# Patient Record
Sex: Female | Born: 1985 | Race: White | Hispanic: No | Marital: Single | Smoking: Current every day smoker
Health system: Southern US, Community
[De-identification: ages and names within clinical notes are randomized; demographics above are authoritative.]

## PROBLEM LIST (undated history)

## (undated) DIAGNOSIS — Z789 Other specified health status: Secondary | ICD-10-CM

## (undated) HISTORY — PX: FACIAL COSMETIC SURGERY: SHX629

## (undated) HISTORY — PX: TONSILLECTOMY: SUR1361

## (undated) HISTORY — PX: COLPOSCOPY: SHX161

## (undated) HISTORY — PX: WISDOM TOOTH EXTRACTION: SHX21

---

## 2015-03-08 ENCOUNTER — Emergency Department (HOSPITAL_COMMUNITY)
Admission: EM | Admit: 2015-03-08 | Discharge: 2015-03-08 | Disposition: A | Discharge status: Home / Self Care | Payer: Self-pay | Attending: Emergency Medicine | Admitting: Emergency Medicine

## 2015-03-08 ENCOUNTER — Encounter (HOSPITAL_COMMUNITY): Payer: Self-pay | Admitting: *Deleted

## 2015-03-08 DIAGNOSIS — F17200 Nicotine dependence, unspecified, uncomplicated: Secondary | ICD-10-CM | POA: Insufficient documentation

## 2015-03-08 DIAGNOSIS — R197 Diarrhea, unspecified: Secondary | ICD-10-CM | POA: Insufficient documentation

## 2015-03-08 DIAGNOSIS — Z79899 Other long term (current) drug therapy: Secondary | ICD-10-CM | POA: Insufficient documentation

## 2015-03-08 DIAGNOSIS — R112 Nausea with vomiting, unspecified: Secondary | ICD-10-CM | POA: Insufficient documentation

## 2015-03-08 LAB — I-STAT CHEM 8, ED
BUN: 11 mg/dL (ref 6–20)
CALCIUM ION: 1.17 mmol/L (ref 1.12–1.23)
CREATININE: 0.7 mg/dL (ref 0.44–1.00)
Chloride: 102 mmol/L (ref 101–111)
GLUCOSE: 73 mg/dL (ref 65–99)
HCT: 47 % — ABNORMAL HIGH (ref 36.0–46.0)
Hemoglobin: 16 g/dL — ABNORMAL HIGH (ref 12.0–15.0)
Potassium: 3.6 mmol/L (ref 3.5–5.1)
Sodium: 139 mmol/L (ref 135–145)
TCO2: 22 mmol/L (ref 0–100)

## 2015-03-08 LAB — I-STAT BETA HCG BLOOD, ED (MC, WL, AP ONLY)

## 2015-03-08 MED ORDER — ONDANSETRON HCL 4 MG/2ML IJ SOLN
4.0000 mg | Freq: Once | INTRAMUSCULAR | Status: AC
  Administered 2015-03-08: 4 mg via INTRAVENOUS
  Filled 2015-03-08: qty 2

## 2015-03-08 MED ORDER — PROMETHAZINE HCL 25 MG PO TABS: 25.0000 mg | ORAL_TABLET | Freq: Four times a day (QID) | ORAL | Status: DC | PRN

## 2015-03-08 MED ORDER — GI COCKTAIL ~~LOC~~: 30.0000 mL | Freq: Once | ORAL | Status: DC

## 2015-03-08 MED ORDER — SODIUM CHLORIDE 0.9 % IV BOLUS (SEPSIS)
1000.0000 mL | Freq: Once | INTRAVENOUS | Status: AC
  Administered 2015-03-08: 1000 mL via INTRAVENOUS

## 2015-03-08 MED ORDER — GI COCKTAIL ~~LOC~~
30.0000 mL | Freq: Once | ORAL | Status: AC
  Administered 2015-03-08: 30 mL via ORAL
  Filled 2015-03-08: qty 30

## 2015-03-08 MED ORDER — SODIUM CHLORIDE 0.9 % IV BOLUS (SEPSIS)
1000.0000 mL | Freq: Once | INTRAVENOUS | Status: AC
Start: 2015-03-08 — End: 2015-03-08
  Administered 2015-03-08: 1000 mL via INTRAVENOUS

## 2015-03-08 MED ORDER — LORAZEPAM 1 MG PO TABS
1.0000 mg | ORAL_TABLET | Freq: Once | ORAL | Status: AC
  Administered 2015-03-08: 1 mg via ORAL
  Filled 2015-03-08: qty 1

## 2015-03-08 NOTE — ED Provider Notes (Signed)
CSN: [id number]     Arrival date & time 03/08/15  [id number] History   First MD Initiated Contact with Patient 03/08/15 1036     Chief Complaint  Patient presents with  . Nausea  . Emesis  . Diarrhea     HPI Patient presents to the emergency department complaining of nausea vomiting and diarrhea over the past 48 hours.  Decreased oral intake.  Denies abdominal pain.  No fevers or chills.  No other complaints.  No urinary complaints.   History reviewed. No pertinent past medical history. Past Surgical History  Procedure Laterality Date  . Tonsillectomy     No family history on file. Social History  Substance Use Topics  . Smoking status: Current Every Day Smoker  . Smokeless tobacco: None  . Alcohol Use: No   OB History    No data available     Review of Systems  All other systems reviewed and are negative.     Allergies  Review of patient's allergies indicates no known allergies.  Home Medications   Prior to Admission medications   Medication Sig Start Date End Date Taking? Authorizing Provider  calcium carbonate (TUMS - DOSED IN MG ELEMENTAL CALCIUM) 500 MG chewable tablet Chew 1 tablet by mouth 2 (two) times daily as needed for indigestion or heartburn.   Yes Historical Provider, MD  ranitidine (ZANTAC) 150 MG tablet Take 150 mg by mouth daily as needed for heartburn.   Yes Historical Provider, MD  sertraline (ZOLOFT) 100 MG tablet Take 100 mg by mouth daily.   Yes Historical Provider, MD  promethazine (PHENERGAN) 25 MG tablet Take 1 tablet (25 mg total) by mouth every 6 (six) hours as needed for nausea or vomiting. 03/08/15   Azalia BilisKevin Toretto Tingler, MD   BP 106/55 mmHg  Pulse 59  Temp(Src) 98.2 F (36.8 C) (Oral)  Resp 20  SpO2 99%  LMP 03/08/2015 Physical Exam  Constitutional: She is oriented to person, place, and time. She appears well-developed and well-nourished. No distress.  HENT:  Head: Normocephalic and atraumatic.  Eyes: EOM are normal.  Neck: Normal range of  motion.  Cardiovascular: Normal rate, regular rhythm and normal heart sounds.   Pulmonary/Chest: Effort normal and breath sounds normal.  Abdominal: Soft. She exhibits no distension. There is no tenderness.  Musculoskeletal: Normal range of motion.  Neurological: She is alert and oriented to person, place, and time.  Skin: Skin is warm and dry.  Psychiatric: She has a normal mood and affect. Judgment normal.  Nursing note and vitals reviewed.   ED Course  Procedures (including critical care time) Labs Review Labs Reviewed  I-STAT CHEM 8, ED - Abnormal; Notable for the following:    Hemoglobin 16.0 (*)    HCT 47.0 (*)    All other components within normal limits  I-STAT BETA HCG BLOOD, ED ([facility], [facility], [facility] ONLY)    Imaging Review No results found. I have personally reviewed and evaluated these images and lab results as part of my medical decision-making.   EKG Interpretation None      MDM   Final diagnoses:  Nausea vomiting and diarrhea    Patient is overall well-appearing.  Feels better.  Discharge home in good condition.    Azalia BilisKevin Angelette Ganus, MD 03/08/15 [phone]

## 2015-03-08 NOTE — ED Notes (Signed)
Patient states since Monday she has had consistent nausea, vomiting and diarrhea. More emesis than diarrhea. She has not tried any thing over the counter other than tums. Patient denies fever. Patient in no acute distress at this time and not actively vomiting.

## 2018-09-14 ENCOUNTER — Encounter (HOSPITAL_BASED_OUTPATIENT_CLINIC_OR_DEPARTMENT_OTHER): Payer: Self-pay | Admitting: Emergency Medicine

## 2018-09-14 ENCOUNTER — Emergency Department (HOSPITAL_BASED_OUTPATIENT_CLINIC_OR_DEPARTMENT_OTHER)
Admission: EM | Admit: 2018-09-14 | Discharge: 2018-09-14 | Disposition: A | Discharge status: Home / Self Care | Payer: Managed Care, Other (non HMO) | Attending: Emergency Medicine | Admitting: Emergency Medicine

## 2018-09-14 ENCOUNTER — Other Ambulatory Visit: Payer: Self-pay

## 2018-09-14 ENCOUNTER — Emergency Department (HOSPITAL_BASED_OUTPATIENT_CLINIC_OR_DEPARTMENT_OTHER): Payer: Managed Care, Other (non HMO)

## 2018-09-14 DIAGNOSIS — O99331 Smoking (tobacco) complicating pregnancy, first trimester: Secondary | ICD-10-CM | POA: Insufficient documentation

## 2018-09-14 DIAGNOSIS — O210 Mild hyperemesis gravidarum: Secondary | ICD-10-CM | POA: Insufficient documentation

## 2018-09-14 DIAGNOSIS — R1110 Vomiting, unspecified: Secondary | ICD-10-CM

## 2018-09-14 DIAGNOSIS — F17210 Nicotine dependence, cigarettes, uncomplicated: Secondary | ICD-10-CM | POA: Diagnosis not present

## 2018-09-14 DIAGNOSIS — Z3490 Encounter for supervision of normal pregnancy, unspecified, unspecified trimester: Secondary | ICD-10-CM

## 2018-09-14 DIAGNOSIS — Z3A01 Less than 8 weeks gestation of pregnancy: Secondary | ICD-10-CM | POA: Insufficient documentation

## 2018-09-14 DIAGNOSIS — O219 Vomiting of pregnancy, unspecified: Secondary | ICD-10-CM | POA: Diagnosis present

## 2018-09-14 LAB — CBC
HCT: 36.4 % (ref 36.0–46.0)
Hemoglobin: 12.7 g/dL (ref 12.0–15.0)
MCH: 30 pg (ref 26.0–34.0)
MCHC: 34.9 g/dL (ref 30.0–36.0)
MCV: 85.8 fL (ref 80.0–100.0)
Platelets: 196 10*3/uL (ref 150–400)
RBC: 4.24 MIL/uL (ref 3.87–5.11)
RDW: 12.5 % (ref 11.5–15.5)
WBC: 11 10*3/uL — ABNORMAL HIGH (ref 4.0–10.5)
nRBC: 0 % (ref 0.0–0.2)

## 2018-09-14 LAB — COMPREHENSIVE METABOLIC PANEL
ALT: 32 U/L (ref 0–44)
AST: 32 U/L (ref 15–41)
Albumin: 4.2 g/dL (ref 3.5–5.0)
Alkaline Phosphatase: 80 U/L (ref 38–126)
Anion gap: 13 (ref 5–15)
BUN: 11 mg/dL (ref 6–20)
CO2: 14 mmol/L — ABNORMAL LOW (ref 22–32)
Calcium: 8.6 mg/dL — ABNORMAL LOW (ref 8.9–10.3)
Chloride: 107 mmol/L (ref 98–111)
Creatinine, Ser: 0.69 mg/dL (ref 0.44–1.00)
GFR calc Af Amer: >60 mL/min (ref >60)
GFR calc non Af Amer: >60 mL/min (ref >60)
Glucose, Bld: 126 mg/dL — ABNORMAL HIGH (ref 70–99)
Potassium: 3.2 mmol/L — ABNORMAL LOW (ref 3.5–5.1)
Sodium: 134 mmol/L — ABNORMAL LOW (ref 135–145)
Total Bilirubin: 0.7 mg/dL (ref 0.3–1.2)
Total Protein: 7.2 g/dL (ref 6.5–8.1)

## 2018-09-14 LAB — BASIC METABOLIC PANEL
Anion gap: 11 (ref 5–15)
BUN: 11 mg/dL (ref 6–20)
CO2: 17 mmol/L — ABNORMAL LOW (ref 22–32)
Calcium: 8.4 mg/dL — ABNORMAL LOW (ref 8.9–10.3)
Chloride: 107 mmol/L (ref 98–111)
Creatinine, Ser: 0.64 mg/dL (ref 0.44–1.00)
GFR calc Af Amer: >60 mL/min (ref >60)
GFR calc non Af Amer: >60 mL/min (ref >60)
Glucose, Bld: 112 mg/dL — ABNORMAL HIGH (ref 70–99)
Potassium: 3.4 mmol/L — ABNORMAL LOW (ref 3.5–5.1)
Sodium: 135 mmol/L (ref 135–145)

## 2018-09-14 LAB — HCG, QUANTITATIVE, PREGNANCY: hCG, Beta Chain, Quant, S: 674 m[IU]/mL — ABNORMAL HIGH (ref <5)

## 2018-09-14 LAB — LIPASE, BLOOD: Lipase: 16 U/L (ref 11–51)

## 2018-09-14 MED ORDER — DOXYLAMINE-PYRIDOXINE 10-10 MG PO TBEC: 1.0000 | DELAYED_RELEASE_TABLET | Freq: Two times a day (BID) | ORAL | 0 refills | Status: DC | PRN

## 2018-09-14 MED ORDER — ONDANSETRON HCL 4 MG/2ML IJ SOLN
4.0000 mg | Freq: Once | INTRAMUSCULAR | Status: AC
  Administered 2018-09-14: 4 mg via INTRAVENOUS
  Filled 2018-09-14: qty 2

## 2018-09-14 MED ORDER — ONDANSETRON 4 MG PO TBDP: 4.0000 mg | ORAL_TABLET | Freq: Three times a day (TID) | ORAL | 0 refills | Status: DC | PRN

## 2018-09-14 MED ORDER — ACETAMINOPHEN 500 MG PO TABS
1000.0000 mg | ORAL_TABLET | Freq: Once | ORAL | Status: AC
Start: 2018-09-14 — End: 2018-09-14
  Administered 2018-09-14: 1000 mg via ORAL
  Filled 2018-09-14: qty 2

## 2018-09-14 MED ORDER — METOCLOPRAMIDE HCL 5 MG/ML IJ SOLN
10.0000 mg | Freq: Once | INTRAMUSCULAR | Status: AC
  Administered 2018-09-14: 10 mg via INTRAVENOUS
  Filled 2018-09-14: qty 2

## 2018-09-14 MED ORDER — DIPHENHYDRAMINE HCL 50 MG/ML IJ SOLN
25.0000 mg | Freq: Once | INTRAMUSCULAR | Status: AC
  Administered 2018-09-14: 25 mg via INTRAVENOUS
  Filled 2018-09-14: qty 1

## 2018-09-14 MED ORDER — SODIUM CHLORIDE 0.9 % IV BOLUS
1000.0000 mL | Freq: Once | INTRAVENOUS | Status: AC
  Administered 2018-09-14: 1000 mL via INTRAVENOUS

## 2018-09-14 MED FILL — DOXYLAMINE-PYRIDOXINE 10-10: 10-10 | 30 days supply | Qty: 60 | Fill #0

## 2018-09-14 MED FILL — ONDANSETRON ODT 4 MG TABLET: 4 | 6 days supply | Qty: 20 | Fill #0

## 2018-09-14 NOTE — ED Notes (Signed)
Patient transported to [facility] 

## 2018-09-14 NOTE — ED Notes (Signed)
Pt verbalized "Im feeling a little better, finally" when returned from [place]

## 2018-09-14 NOTE — ED Provider Notes (Addendum)
[facility] Emergency Department Provider Note MRN:  [id number]  Arrival date & time: 09/14/18     Chief Complaint   Nausea and Abdominal Pain   History of Present Illness   Sierra Carter is a 33 y.o. year-old female with no pertinent past medical history presenting to the ED with chief complaint of nausea and abdominal pain.  Patient has been experiencing significant nausea, vomiting, diarrhea for the past 12 hours.  Endorsing some generalized abdominal cramping discomfort.  Patient reports that she is pregnant, first day of last menstrual period was sometime in late May.  Has not had follow-up with OB or GYN.  She denies fever, no chest pain.  Feeling short of breath due to the nausea.  Discomfort is moderate to severe, constant, no exacerbating relieving factors.  Review of Systems  A complete 10 system review of systems was obtained and all systems are negative except as noted in the HPI and PMH.   Patient's Health History   History reviewed. No pertinent past medical history.  Past Surgical History:  Procedure Laterality Date  . COLPOSCOPY     x 2  . FACIAL COSMETIC SURGERY     nose job  . TONSILLECTOMY    . WISDOM TOOTH EXTRACTION      No family history on file.  Social History   Socioeconomic History  . Marital status: Single    Spouse name: Not on file  . Number of children: Not on file  . Years of education: Not on file  . Highest education level: Not on file  Occupational History  . Not on file  Social Needs  . Financial resource strain: Not on file  . Food insecurity    Worry: Not on file    Inability: Not on file  . Transportation needs    Medical: Not on file    Non-medical: Not on file  Tobacco Use  . Smoking status: Current Every Day Smoker    Packs/day: 0.50    Types: Cigarettes  . Smokeless tobacco: Never Used  Substance and Sexual Activity  . Alcohol use: No  . Drug use: Yes    Types: Marijuana  . Sexual activity:  Yes  Lifestyle  . Physical activity    Days per week: Not on file    Minutes per session: Not on file  . Stress: Not on file  Relationships  . Social Musicianconnections    Talks on phone: Not on file    Gets together: Not on file    Attends religious service: Not on file    Active member of club or organization: Not on file    Attends meetings of clubs or organizations: Not on file    Relationship status: Not on file  . Intimate partner violence    Fear of current or ex partner: Not on file    Emotionally abused: Not on file    Physically abused: Not on file    Forced sexual activity: Not on file  Other Topics Concern  . Not on file  Social History Narrative  . Not on file     Physical Exam  Vital Signs and Nursing Notes reviewed Vitals:   09/14/18 0655 09/14/18 0657  BP:  119/60  Pulse:  61  Resp:  (!) 22  Temp:  97.9 F (36.6 C)  SpO2: 100% 100%    CONSTITUTIONAL: Well-appearing, in moderate distress due to discomfort NEURO:  Alert and oriented x 3, no focal deficits EYES:  eyes equal and reactive ENT/NECK:  no LAD, no JVD CARDIO: Regular rate, well-perfused, normal S1 and S2 PULM:  CTAB no wheezing or rhonchi, tachypneic GI/GU:  normal bowel sounds, non-distended, non-tender MSK/SPINE:  No gross deformities, no edema SKIN:  no rash, atraumatic PSYCH:  Appropriate speech and behavior  Diagnostic and Interventional Summary    EKG Interpretation  Date/Time:  Tuesday September 14 2018 07:18:37 EDT Ventricular Rate:  81 PR Interval:    QRS Duration: 78 QT Interval:  391 QTC Calculation: 454 R Axis:   79 Text Interpretation:  Sinus rhythm Confirmed by Kennis CarinaBero, Breigh Annett [phone]([id number]) on 09/14/2018 7:26:31 AM      Labs Reviewed  CBC - Abnormal; Notable for the following components:      Result Value   WBC 11.0 (*)    All other components within normal limits  COMPREHENSIVE METABOLIC PANEL - Abnormal; Notable for the following components:   Sodium 134 (*)    Potassium 3.2 (*)     CO2 14 (*)    Glucose, Bld 126 (*)    Calcium 8.6 (*)    All other components within normal limits  HCG, QUANTITATIVE, PREGNANCY - Abnormal; Notable for the following components:   hCG, Beta Chain, Quant, S 674 (*)    All other components within normal limits  BASIC METABOLIC PANEL - Abnormal; Notable for the following components:   Potassium 3.4 (*)    CO2 17 (*)    Glucose, Bld 112 (*)    Calcium 8.4 (*)    All other components within normal limits  LIPASE, BLOOD  URINALYSIS, ROUTINE W REFLEX MICROSCOPIC    US OB Comp < 14 Wks  Final Result    US OB Transvaginal  Final Result      Medications  sodium chloride 0.9 % bolus 1,000 mL (0 mLs Intravenous Stopped 09/14/18 0835)  metoCLOPramide (REGLAN) injection 10 mg (10 mg Intravenous Given 09/14/18 0720)  diphenhydrAMINE (BENADRYL) injection 25 mg (25 mg Intravenous Given 09/14/18 0720)  acetaminophen (TYLENOL) tablet 1,000 mg (1,000 mg Oral Given 09/14/18 [id number])     Procedures Critical Care  ED Course and Medical Decision Making  I have reviewed the triage vital signs and the nursing notes.  Pertinent labs & imaging results that were available during my care of the patient were reviewed by me and considered in my medical decision making (see below for details).  Favoring hyperemesis gravidarum, will attempt symptomatic management and obtain labs to evaluate for metabolic disarray versus AKI.  We will also obtain ultrasound imaging today to confirm intrauterine pregnancy and exclude molar pregnancy.  Clinical Course as of Sep 13 1036  Tue Sep 14, 2018  [id number] Based on patient's reported first day of last menstrual period, her estimated gestational age is between 636 and 7 weeks   [MB]  [phone] Patient has made a dramatic recovery, looking and feeling much better, now tolerating p.o.  Patient's labs showed a fairly significant metabolic alkalosis, likely related to her frequent vomiting.  Will recheck to ensure it is improving and  then hopefully discharge.   [MB]    Clinical Course User Index [MB] Sabas SousBero, Cleland Simkins M, MD    BMP shows improving values, patient continues to look and feel well, tolerating p.o., appropriate for discharge with symptomatic management and GYN follow-up.  11 AM update: Patient began having return of severe symptoms just prior to discharge.  No longer tolerating p.o.  Had a total of 30 minutes of relief.  Will request admission  for symptomatic management of hyperemesis gravidarum.  11:20 AM update: Patient feeling much better after IV Zofran and would like to try to manage the symptoms at home, no longer interested in admission.  This seems appropriate given how she appears, now tolerating p.o.  Discharged with nausea regimen.  Barth Kirks. Sedonia Small, [place] [email or web address]  Final Clinical Impressions(s) / ED Diagnoses     ICD-10-CM   1. Pregnancy at early stage  Z34.90   2. Hyperemesis  R11.10     ED Discharge Orders         Ordered    Doxylamine-Pyridoxine 10-10 MG TBEC  2 times daily PRN     09/14/18 1037             Maudie Flakes, MD 09/14/18 1038    Maudie Flakes, MD 09/14/18 1109    Maudie Flakes, MD 09/14/18 1125

## 2018-09-14 NOTE — Discharge Instructions (Signed)
You were evaluated in the Emergency Department and after careful evaluation, we did not find any emergent condition requiring admission or further testing in the hospital.  Your symptoms today seem to be due to morning sickness.  You can use the medication provided as needed for continued symptoms.  We recommend following up with a GYN doctor for continued care.  You will need a repeat ultrasound in about 2 weeks.  Please return to the Emergency Department if you experience any worsening of your condition.  We encourage you to follow up with a primary care provider.  Thank you for allowing [place] to be a part of your care.

## 2018-09-14 NOTE — ED Notes (Signed)
Pt c/o abdominal pain and N/V x 2 days. Pt states she is pregnant. Pt writhing on stretcher, states she cant get comfortable. Pt denies fever.

## 2018-09-15 ENCOUNTER — Ambulatory Visit: Payer: Managed Care, Other (non HMO) | Admitting: Obstetrics & Gynecology

## 2018-09-16 ENCOUNTER — Telehealth: Payer: Self-pay

## 2018-09-16 ENCOUNTER — Ambulatory Visit: Payer: Managed Care, Other (non HMO) | Admitting: Obstetrics & Gynecology

## 2018-09-16 NOTE — Telephone Encounter (Signed)
Called and left message for patient to return call to office to start virtual visit. Kathrene Alu RN

## 2019-04-11 ENCOUNTER — Inpatient Hospital Stay (HOSPITAL_COMMUNITY)
Admission: AD | Admit: 2019-04-11 | Discharge: 2019-04-11 | Disposition: A | Discharge status: Home / Self Care | Payer: 59 | Attending: Obstetrics and Gynecology | Admitting: Obstetrics and Gynecology

## 2019-04-11 ENCOUNTER — Encounter (HOSPITAL_COMMUNITY): Payer: Self-pay | Admitting: Obstetrics and Gynecology

## 2019-04-11 ENCOUNTER — Telehealth: Payer: Self-pay

## 2019-04-11 ENCOUNTER — Other Ambulatory Visit: Payer: Self-pay

## 2019-04-11 ENCOUNTER — Inpatient Hospital Stay (HOSPITAL_COMMUNITY): Payer: 59

## 2019-04-11 DIAGNOSIS — O209 Hemorrhage in early pregnancy, unspecified: Secondary | ICD-10-CM | POA: Diagnosis present

## 2019-04-11 DIAGNOSIS — Z3A01 Less than 8 weeks gestation of pregnancy: Secondary | ICD-10-CM | POA: Insufficient documentation

## 2019-04-11 DIAGNOSIS — O039 Complete or unspecified spontaneous abortion without complication: Secondary | ICD-10-CM | POA: Diagnosis not present

## 2019-04-11 DIAGNOSIS — F17210 Nicotine dependence, cigarettes, uncomplicated: Secondary | ICD-10-CM | POA: Diagnosis not present

## 2019-04-11 DIAGNOSIS — Z6790 Unspecified blood type, Rh positive: Secondary | ICD-10-CM

## 2019-04-11 DIAGNOSIS — O4690 Antepartum hemorrhage, unspecified, unspecified trimester: Secondary | ICD-10-CM

## 2019-04-11 HISTORY — DX: Other specified health status: Z78.9

## 2019-04-11 LAB — CBC
HCT: 36.8 % (ref 36.0–46.0)
Hemoglobin: 12.8 g/dL (ref 12.0–15.0)
MCH: 30.5 pg (ref 26.0–34.0)
MCHC: 34.8 g/dL (ref 30.0–36.0)
MCV: 87.6 fL (ref 80.0–100.0)
Platelets: 176 10*3/uL (ref 150–400)
RBC: 4.2 MIL/uL (ref 3.87–5.11)
RDW: 12 % (ref 11.5–15.5)
WBC: 9.4 10*3/uL (ref 4.0–10.5)
nRBC: 0 % (ref 0.0–0.2)

## 2019-04-11 LAB — COMPREHENSIVE METABOLIC PANEL
ALT: 22 U/L (ref 0–44)
AST: 20 U/L (ref 15–41)
Albumin: 3.8 g/dL (ref 3.5–5.0)
Alkaline Phosphatase: 63 U/L (ref 38–126)
Anion gap: 10 (ref 5–15)
BUN: 9 mg/dL (ref 6–20)
CO2: 25 mmol/L (ref 22–32)
Calcium: 9.1 mg/dL (ref 8.9–10.3)
Chloride: 103 mmol/L (ref 98–111)
Creatinine, Ser: 0.79 mg/dL (ref 0.44–1.00)
GFR calc Af Amer: >60 mL/min (ref >60)
GFR calc non Af Amer: >60 mL/min (ref >60)
Glucose, Bld: 83 mg/dL (ref 70–99)
Potassium: 3.9 mmol/L (ref 3.5–5.1)
Sodium: 138 mmol/L (ref 135–145)
Total Bilirubin: 0.3 mg/dL (ref 0.3–1.2)
Total Protein: 6.5 g/dL (ref 6.5–8.1)

## 2019-04-11 LAB — URINALYSIS, ROUTINE W REFLEX MICROSCOPIC
Bilirubin Urine: NEGATIVE
Glucose, UA: NEGATIVE mg/dL
Ketones, ur: NEGATIVE mg/dL
Nitrite: NEGATIVE
Protein, ur: 30 mg/dL — AB
Specific Gravity, Urine: 1.015 (ref 1.005–1.030)
WBC, UA: >50 WBC/hpf — ABNORMAL HIGH (ref 0–5)
pH: 5 (ref 5.0–8.0)

## 2019-04-11 LAB — HCG, QUANTITATIVE, PREGNANCY: hCG, Beta Chain, Quant, S: 2354 m[IU]/mL — ABNORMAL HIGH (ref <5)

## 2019-04-11 LAB — POCT PREGNANCY, URINE: Preg Test, Ur: POSITIVE — AB

## 2019-04-11 MED ORDER — OXYCODONE-ACETAMINOPHEN 5-325 MG PO TABS
1.0000 | ORAL_TABLET | Freq: Once | ORAL | Status: AC
  Administered 2019-04-11: 1 via ORAL
  Filled 2019-04-11: qty 1

## 2019-04-11 MED ORDER — OXYCODONE-ACETAMINOPHEN 5-325 MG PO TABS: 1.0000 | ORAL_TABLET | Freq: Four times a day (QID) | ORAL | 0 refills | Status: AC | PRN

## 2019-04-11 NOTE — MAU Provider Note (Signed)
History     CSN: [id number]  Arrival date and time: 04/11/19 1223   First Provider Initiated Contact with Patient 04/11/19 1352      Chief Complaint  Patient presents with  . Vaginal Bleeding  . Abdominal Pain  . Possible Pregnancy   [name] is a 34 y.o. G2P0010 at [redacted]w[redacted]d who presents to MAU for vaginal bleeding which began yesterday, early afternoon. Pt reports the bleeding started as spotting that she first noticed in her underwear and reports it was a dark-red color. Pt reports she put on a pad, which started to accumulate blood within an hour. Pt reports cramping started last night and bleeding got heavier. Pt reports prior to bedtime last night, she was experiencing clotting, and this morning when she awoke, her pad was "totally full." Pt reports bleeding had been dark, but reports bleeding is bright red here in MAU. Pt believes this is a miscarriage, as she experienced one in July. Pt offered and declines opportunity to speak with a counselor.  Passing blood clots? per above Blood soaking clothes? no Lightheaded/dizzy? no Significant pelvic pain or cramping? per above, 7/10, pt reports she has bad menstrual cramps and this is similar Passed any tissue? no  Current pregnancy problems? Pt has not yet been seen Blood Type? unknown Allergies? NKDA Current medications? Ibuprofen (last took 800mg  @8AM ), Zoloftm Pepcid Current [facility] & next appt? [facility], 04/26/2019  Pt denies vaginal discharge/odor/itching. Pt denies N/V, abdominal pain, constipation, diarrhea, or urinary problems. Pt denies fever, chills, fatigue, sweating or changes in appetite. Pt denies SOB or chest pain. Pt denies dizziness, HA, light-headedness, weakness.   OB History    Gravida  2   Para      Term      Preterm      AB  1   Living        SAB  1   TAB      Ectopic      Multiple      Live Births              Past Medical History:  Diagnosis Date  . Medical history  non-contributory     Past Surgical History:  Procedure Laterality Date  . COLPOSCOPY     x 2  . FACIAL COSMETIC SURGERY     nose job  . TONSILLECTOMY    . WISDOM TOOTH EXTRACTION      No family history on file.  Social History   Tobacco Use  . Smoking status: Current Every Day Smoker    Packs/day: 0.50    Types: Cigarettes  . Smokeless tobacco: Never Used  Substance Use Topics  . Alcohol use: No  . Drug use: Yes    Types: Marijuana    Allergies: No Known Allergies  Medications Prior to Admission  Medication Sig Dispense Refill Last Dose  . famotidine (PEPCID) 20 MG tablet Take 20 mg by mouth daily.     . sertraline (ZOLOFT) 100 MG tablet Take 100 mg by mouth daily.   04/11/2019 at Unknown time  . calcium carbonate (TUMS - DOSED IN MG ELEMENTAL CALCIUM) 500 MG chewable tablet Chew 1 tablet by mouth 2 (two) times daily as needed for indigestion or heartburn.     . Doxylamine-Pyridoxine 10-10 MG TBEC Take 1 tablet by mouth 2 (two) times daily as needed (nausea). 60 tablet 0   . ondansetron (ZOFRAN ODT) 4 MG disintegrating tablet Take 1 tablet (4 mg total) by mouth  every 8 (eight) hours as needed for nausea or vomiting. 20 tablet 0   . promethazine (PHENERGAN) 25 MG tablet Take 1 tablet (25 mg total) by mouth every 6 (six) hours as needed for nausea or vomiting. 12 tablet 0   . ranitidine (ZANTAC) 150 MG tablet Take 150 mg by mouth daily as needed for heartburn.       Review of Systems  Constitutional: Negative for chills, diaphoresis, fatigue and fever.  Eyes: Negative for visual disturbance.  Respiratory: Negative for shortness of breath.   Cardiovascular: Negative for chest pain.  Gastrointestinal: Negative for abdominal pain, constipation, diarrhea, nausea and vomiting.  Genitourinary: Positive for pelvic pain and vaginal bleeding. Negative for dysuria, flank pain, frequency, urgency and vaginal discharge.  Musculoskeletal: Positive for back pain.  Neurological:  Negative for dizziness, weakness, light-headedness and headaches.   Physical Exam   Blood pressure 119/60, pulse 73, temperature 99 F (37.2 C), temperature source Oral, resp. rate 18, height 5' 6.5" (1.689 m), weight 80 kg, last menstrual period 02/16/2019, SpO2 99 %, unknown if currently breastfeeding.  Patient Vitals for the past 24 hrs:  BP Temp Temp src Pulse Resp SpO2 Height Weight  04/11/19 1254 119/60 99 F (37.2 C) Oral 73 18 99 % 5' 6.5" (1.689 m) 80 kg   Physical Exam  Constitutional: She is oriented to person, place, and time. She appears well-developed and well-nourished. No distress.  HENT:  Head: Normocephalic and atraumatic.  Respiratory: Effort normal.  GI: Soft.  Neurological: She is alert and oriented to person, place, and time.  Skin: Skin is warm and dry. She is not diaphoretic.  Psychiatric: She has a normal mood and affect. Her behavior is normal. Judgment and thought content normal.  Pt declines pelvic exam after discussion of importance and possible relief of symptoms if gestational sac/debris present at cervical os, pt declines after discussion stating "I just can't take any more today."  Results for orders placed or performed during the hospital encounter of 04/11/19 (from the past 24 hour(s))  Pregnancy, urine POC     Status: Abnormal   Collection Time: 04/11/19  1:07 PM  Result Value Ref Range   Preg Test, Ur POSITIVE (A) NEGATIVE  CBC     Status: None   Collection Time: 04/11/19  1:26 PM  Result Value Ref Range   WBC 9.4 4.0 - 10.5 K/uL   RBC 4.20 3.87 - 5.11 MIL/uL   Hemoglobin 12.8 12.0 - 15.0 g/dL   HCT 36.1 44.3 - 15.4 %   MCV 87.6 80.0 - 100.0 fL   MCH 30.5 26.0 - 34.0 pg   MCHC 34.8 30.0 - 36.0 g/dL   RDW 00.8 67.6 - 19.5 %   Platelets 176 150 - 400 K/uL   nRBC 0.0 0.0 - 0.2 %  Comprehensive metabolic panel     Status: None   Collection Time: 04/11/19  1:26 PM  Result Value Ref Range   Sodium 138 135 - 145 mmol/L   Potassium 3.9 3.5 -  5.1 mmol/L   Chloride 103 98 - 111 mmol/L   CO2 25 22 - 32 mmol/L   Glucose, Bld 83 70 - 99 mg/dL   BUN 9 6 - 20 mg/dL   Creatinine, Ser 0.93 0.44 - 1.00 mg/dL   Calcium 9.1 8.9 - 26.7 mg/dL   Total Protein 6.5 6.5 - 8.1 g/dL   Albumin 3.8 3.5 - 5.0 g/dL   AST 20 15 - 41 U/L   ALT 22  0 - 44 U/L   Alkaline Phosphatase 63 38 - 126 U/L   Total Bilirubin 0.3 0.3 - 1.2 mg/dL   GFR calc non Af Amer >60 >60 mL/min   GFR calc Af Amer >60 >60 mL/min   Anion gap 10 5 - 15  ABO/Rh     Status: None   Collection Time: 04/11/19  1:26 PM  Result Value Ref Range   ABO/RH(D)      A POS Performed at [facility], [address] [place], [place] [address]   hCG, quantitative, pregnancy     Status: Abnormal   Collection Time: 04/11/19  1:26 PM  Result Value Ref Range   hCG, Beta Chain, Quant, S 2,354 (H) <5 mIU/mL  Urinalysis, Routine w reflex microscopic     Status: Abnormal   Collection Time: 04/11/19  1:40 PM  Result Value Ref Range   Color, Urine YELLOW YELLOW   APPearance HAZY (A) CLEAR   Specific Gravity, Urine 1.015 1.005 - 1.030   pH 5.0 5.0 - 8.0   Glucose, UA NEGATIVE NEGATIVE mg/dL   Hgb urine dipstick LARGE (A) NEGATIVE   Bilirubin Urine NEGATIVE NEGATIVE   Ketones, ur NEGATIVE NEGATIVE mg/dL   Protein, ur 30 (A) NEGATIVE mg/dL   Nitrite NEGATIVE NEGATIVE   Leukocytes,Ua MODERATE (A) NEGATIVE   RBC / HPF 6-10 0 - 5 RBC/hpf   WBC, UA >50 (H) 0 - 5 WBC/hpf   Bacteria, UA FEW (A) NONE SEEN   Squamous Epithelial / LPF 0-5 0 - 5   Mucus PRESENT    US OB LESS THAN 14 WEEKS WITH OB TRANSVAGINAL  Result Date: 04/11/2019 CLINICAL DATA:  Heavy vaginal bleeding and positive urine pregnancy test. Quantitative beta HCG is pending. LMP was 02/16/2019. EDC by LMP is 11/23/2019. By LMP patient is 7 weeks 5 days. Gravida 2 AB1. EXAM: OBSTETRIC <14 WK [place] AND TRANSVAGINAL OB US TECHNIQUE: Both transabdominal and transvaginal ultrasound examinations were performed for complete evaluation  of the gestation as well as the maternal uterus, adnexal regions, and pelvic cul-de-sac. Transvaginal technique was performed to assess early pregnancy. COMPARISON:  09/14/2018 FINDINGS: Intrauterine gestational sac: None Yolk sac:  Not Visualized. Embryo:  Not Visualized. Cardiac Activity: Not Visualized. Subchorionic hemorrhage:  None visualized. Maternal uterus/adnexae: Endometrium is homogeneously thickened, 13 millimeters in thickness. The ovaries are normal in appearance. Trace amount of free pelvic fluid. IMPRESSION: 1. Pregnancy of unknown location. 2. Considerations include completed spontaneous abortion, early intrauterine pregnancy or early ectopic pregnancy. 3. Serial quantitative beta HCG values and follow-up ultrasound are recommended as appropriate to document progression of and location of pregnancy. 4. Ectopic pregnancy has not been excluded. Electronically Signed   By: Norva Pavlov M.D.   On: 04/11/2019 14:52   MAU Course  Procedures  MDM -r/o ectopic -UA: hazy/lg hgb/30PRO/mod leuks/few bacteria, sending urine for culture -CBC: WNL -CMP: WNL -[place]: pregnancy of unknown anatomic location -hCG: 2,354 -ABO: A Positive -WetPrep/GC/CT not performed d/t pt declined pelvic exam -suspect miscarriage -pt given Percocet for pain after discussion of differential diagnoses, pt elects to use Percocet and requests RX for home use -patient passed object resembling likely gestational sac while in MAU, sent to pathology -pt reports pain after Percocet 1/10 -pt discharged to home in stable condition  Orders Placed This Encounter  Procedures  . Wet prep, genital    Standing Status:   Standing    Number of Occurrences:   1  . Culture, OB Urine  Standing Status:   Standing    Number of Occurrences:   1  . US OB LESS THAN 14 WEEKS WITH OB TRANSVAGINAL    Standing Status:   Standing    Number of Occurrences:   1    Order Specific Question:   Symptom/Reason for Exam    Answer:    Vaginal bleeding in pregnancy [[id number]]  . Urinalysis, Routine w reflex microscopic    Standing Status:   Standing    Number of Occurrences:   1  . CBC    Standing Status:   Standing    Number of Occurrences:   1  . Comprehensive metabolic panel    Standing Status:   Standing    Number of Occurrences:   1  . hCG, quantitative, pregnancy    Standing Status:   Standing    Number of Occurrences:   1  . Pregnancy, urine POC    Standing Status:   Standing    Number of Occurrences:   1  . ABO/Rh    Standing Status:   Standing    Number of Occurrences:   1  . Discharge patient    Order Specific Question:   Discharge disposition    Answer:   01-Home or Self Care [1]    Order Specific Question:   Discharge patient date    Answer:   04/11/2019   Meds ordered this encounter  Medications  . oxyCODONE-acetaminophen (PERCOCET/ROXICET) 5-325 MG per tablet 1 tablet  . oxyCODONE-acetaminophen (PERCOCET) 5-325 MG tablet    Sig: Take 1-2 tablets by mouth every 6 (six) hours as needed for up to 8 doses for severe pain.    Dispense:  8 tablet    Refill:  0    Order Specific Question:   Supervising Provider    Answer:   Conan Bowens [[id number]]   Assessment and Plan   1. Miscarriage   2. Vaginal bleeding in pregnancy   3. Blood type, Rh positive    Allergies as of 04/11/2019   No Known Allergies     Medication List    TAKE these medications   calcium carbonate 500 MG chewable tablet Commonly known as: TUMS - dosed in mg elemental calcium Chew 1 tablet by mouth 2 (two) times daily as needed for indigestion or heartburn.   Doxylamine-Pyridoxine 10-10 MG Tbec Take 1 tablet by mouth 2 (two) times daily as needed (nausea).   famotidine 20 MG tablet Commonly known as: PEPCID Take 20 mg by mouth daily.   ondansetron 4 MG disintegrating tablet Commonly known as: Zofran ODT Take 1 tablet (4 mg total) by mouth every 8 (eight) hours as needed for nausea or vomiting.   oxyCODONE-acetaminophen  5-325 MG tablet Commonly known as: Percocet Take 1-2 tablets by mouth every 6 (six) hours as needed for up to 8 doses for severe pain.   promethazine 25 MG tablet Commonly known as: PHENERGAN Take 1 tablet (25 mg total) by mouth every 6 (six) hours as needed for nausea or vomiting.   ranitidine 150 MG tablet Commonly known as: ZANTAC Take 150 mg by mouth daily as needed for heartburn.   sertraline 100 MG tablet Commonly known as: ZOLOFT Take 100 mg by mouth daily.       -discussed differential diagnoses including miscarriage, early pregnancy, ectopic pregnancy -RX Percocet -work note given per pt request -message sent to [facility] to schedule patient for f/u hCG on Wednesday 04/13/2019 -pt advised to call [facility] on  Tuesday afternoon if she does not hear from them prior -pt advised pelvic rest until resolution of miscarriage or determination of pregnancy status -strict ectopic/bleeding/pain/return MAU precautions discussed -message -pt discharged to home in stable condition  Joni Reining E Ying Blankenhorn 04/11/2019, 5:29 PM

## 2019-04-11 NOTE — Telephone Encounter (Signed)
Pt is scheduled for a new ob appt on 04/26/19.Pt called the office stating she is having some heavy bleeding. Advised pt to go to [facility]. Understanding was voiced.  Alvenia Treese l Alban Marucci, CMA

## 2019-04-11 NOTE — MAU Note (Signed)
A week and a half ago, took 3 HPTs, all were +.  Yesterday, started cramping and spotting, has increased since then. Has not had preg confirmed yet, has appt 2/23.

## 2019-04-11 NOTE — Discharge Instructions (Signed)
Miscarriage A miscarriage is the loss of an unborn baby (fetus) before the 20th week of pregnancy. Most miscarriages happen during the first 3 months of pregnancy. Sometimes, a miscarriage can happen before a woman knows that she is pregnant. Having a miscarriage can be an emotional experience. If you have had a miscarriage, talk with your health care provider about any questions you may have about miscarrying, the grieving process, and your plans for future pregnancy. What are the causes? A miscarriage may be caused by:  Problems with the genes or chromosomes of the fetus. These problems make it impossible for the baby to develop normally. They are often the result of random errors that occur early in the development of the baby, and are not passed from parent to child (not inherited).  Infection of the cervix or uterus.  Conditions that affect hormone balance in the body.  Problems with the cervix, such as the cervix opening and thinning before pregnancy is at term (cervical insufficiency).  Problems with the uterus. These may include: ? A uterus with an abnormal shape. ? Fibroids in the uterus. ? Congenital abnormalities. These are problems that were present at birth.  Certain medical conditions.  Smoking, drinking alcohol, or using drugs.  Injury (trauma). In many cases, the cause of a miscarriage is not known. What are the signs or symptoms? Symptoms of this condition include:  Vaginal bleeding or spotting, with or without cramps or pain.  Pain or cramping in the abdomen or lower back.  Passing fluid, tissue, or blood clots from the vagina. How is this diagnosed? This condition may be diagnosed based on:  A physical exam.  Ultrasound.  Blood tests.  Urine tests. How is this treated? Treatment for a miscarriage is sometimes not necessary if you naturally pass all the tissue that was in your uterus. If necessary, this condition may be treated with:  Dilation and  curettage (D&C). This is a procedure in which the cervix is stretched open and the lining of the uterus (endometrium) is scraped. This is done only if tissue from the fetus or placenta remains in the body (incomplete miscarriage).  Medicines, such as: ? Antibiotic medicine, to treat infection. ? Medicine to help the body pass any remaining tissue. ? Medicine to reduce (contract) the size of the uterus. These medicines may be given if you have a lot of bleeding. If you have Rh negative blood and your baby was Rh positive, you will need a shot of a medicine called Rh immunoglobulinto protect your future babies from Rh blood problems. "Rh-negative" and "Rh-positive" refer to whether or not the blood has a specific protein found on the surface of red blood cells (Rh factor). Follow these instructions at home: Medicines   Take over-the-counter and prescription medicines only as told by your health care provider.  If you were prescribed antibiotic medicine, take it as told by your health care provider. Do not stop taking the antibiotic even if you start to feel better.  Do not take NSAIDs, such as aspirin and ibuprofen, unless they are approved by your health care provider. These medicines can cause bleeding. Activity  Rest as directed. Ask your health care provider what activities are safe for you.  Have someone help with home and family responsibilities during this time. General instructions  Keep track of the number of sanitary pads you use each day and how soaked (saturated) they are. Write down this information.  Monitor the amount of tissue or blood clots that   you pass from your vagina. Save any large amounts of tissue for your health care provider to examine.  Do not use tampons, douche, or have sex until your health care provider approves.  To help you and your partner with the process of grieving, talk with your health care provider or seek counseling.  When you are ready, meet with  your health care provider to discuss any important steps you should take for your health. Also, discuss steps you should take to have a healthy pregnancy in the future.  Keep all follow-up visits as told by your health care provider. This is important. Where to find more information  The American Congress of Obstetricians and Gynecologists: www.acog.org  [place] Department of Health and Human Services Office of Women's Health: www.womenshealth.gov Contact a health care provider if:  You have a fever or chills.  You have a foul smelling vaginal discharge.  You have more bleeding instead of less. Get help right away if:  You have severe cramps or pain in your back or abdomen.  You pass blood clots or tissue from your vagina that is walnut-sized or larger.  You soak more than 1 regular sanitary pad in an hour.  You become light-headed or weak.  You pass out.  You have feelings of sadness that take over your thoughts, or you have thoughts of hurting yourself. Summary  Most miscarriages happen in the first 3 months of pregnancy. Sometimes miscarriage happens before a woman even knows that she is pregnant.  Follow your health care provider's instruction for home care. Keep all follow-up appointments.  To help you and your partner with the process of grieving, talk with your health care provider or seek counseling. This information is not intended to replace advice given to you by your health care provider. Make sure you discuss any questions you have with your health care provider. Document Revised: 06/11/2018 Document Reviewed: 03/25/2016 Elsevier Patient Education  2020 Else[employer] Inc.   Managing Pregnancy Loss Pregnancy loss can happen any time during a pregnancy. Often the cause is not known. It is rarely because of anything you did. Pregnancy loss in early pregnancy (during the first trimester) is called a miscarriage. This type of pregnancy loss is the most common. Pregnancy loss  that happens after 20 weeks of pregnancy is called fetal demise if the baby's heart stops beating before birth. Fetal demise is much less common. Some women experience spontaneous labor shortly after fetal demise resulting in a stillborn birth (stillbirth). Any pregnancy loss can be devastating. You will need to recover both physically and emotionally. Most women are able to get pregnant again after a pregnancy loss and deliver a healthy baby. How to manage emotional recovery  Pregnancy loss is very hard emotionally. You may feel many different emotions while you grieve. You may feel sad and angry. You may also feel guilty. It is normal to have periods of crying. Emotional recovery can take longer than physical recovery. It is different for everyone. Taking these steps can help you in managing this loss:  Remember that it is unlikely you did anything to cause the pregnancy loss.  Share your thoughts and feelings with friends, family, and your partner. Remember that your partner is also recovering emotionally.  Make sure you have a good support system. Do not spend too much time alone.  Meet with a pregnancy loss counselor or join a pregnancy loss support group.  Get enough sleep and eat a healthy diet. Return to regular exercise   when you have recovered physically.  Do not use drugs or alcohol to manage your emotions.  Consider seeing a mental health professional to help you recover emotionally.  Ask a friend or loved one to help you decide what to do with any clothing and nursery items you received for your baby. In the case of a stillbirth, many women benefit from taking additional steps in the grieving process. You may want to:  Hold your baby after the birth.  Name your baby.  Request a birth certificate.  Create a keepsake such as handprints or footprints.  Dress your baby and have a picture taken.  Make funeral arrangements.  Ask for a baptism or blessing. Hospitals have  staff members who can help you with all these arrangements. How to recognize emotional stress It is normal to have emotional stress after a pregnancy loss. But emotional stress that lasts a long time or becomes severe requires treatment. Watch out for these signs of severe emotional stress:  Sadness, anger, or guilt that is not going away and is interfering with your normal activities.  Relationship problems that have occurred or gotten worse since the pregnancy loss.  Signs of depression that last longer than 2 weeks. These may include: ? Sadness. ? Anxiety. ? Hopelessness. ? Loss of interest in activities you enjoy. ? Inability to concentrate. ? Trouble sleeping or sleeping too much. ? Loss of appetite or overeating. ? Thoughts of death or of hurting yourself. Follow these instructions at home:  Take over-the-counter and prescription medicines only as told by your health care provider.  Rest at home until your energy level returns. Return to your normal activities as told by your health care provider. Ask your health care provider what activities are safe for you.  When you are ready, meet with your health care provider to discuss steps to take for a future pregnancy.  Keep all follow-up visits as told by your health care provider. This is important. Where to find support  To help you and your partner with the process of grieving, talk with your health care provider or seek counseling.  Consider meeting with others who have experienced pregnancy loss. Ask your health care provider about support groups and resources. Where to find more information  [place] Department of Health and Human Services Office on Women's Health: www.womenshealth.gov  American Pregnancy Association: www.americanpregnancy.org Contact a health care provider if:  You continue to experience grief, sadness, or lack of motivation for everyday activities, and those feelings do not improve over time.  You are  struggling to recover emotionally, especially if you are using alcohol or substances to help. Get help right away if:  You have thoughts of hurting yourself or others. If you ever feel like you may hurt yourself or others, or have thoughts about taking your own life, get help right away. You can go to your nearest emergency department or call:  Your local emergency services (911 in the [place]  A suicide crisis helpline, such as the National Suicide Prevention Lifeline at 1-800-273-8255. This is open 24 hours a day. Summary  Any pregnancy loss can be difficult physically and emotionally.  You may experience many different emotions while you grieve. Emotional recovery can last longer than physical recovery.  It is normal to have emotional stress after a pregnancy loss. But emotional stress that lasts a long time or becomes severe requires treatment.  See your health care provider if you are struggling emotionally after a pregnancy loss. This information   is not intended to replace advice given to you by your health care provider. Make sure you discuss any questions you have with your health care provider. Document Revised: 06/09/2018 Document Reviewed: 04/30/2017 Elsevier Patient Education  2020 Else[employer] Inc.  Ectopic Pregnancy  An ectopic pregnancy is when the fertilized egg attaches (implants) outside the uterus. Most ectopic pregnancies occur in one of the tubes where eggs travel from the ovary to the uterus (fallopian tubes), but the implanting can occur in other locations. In rare cases, ectopic pregnancies occur on the ovary, intestine, pelvis, abdomen, or cervix. In an ectopic pregnancy, the fertilized egg does not have the ability to develop into a normal, healthy baby. A ruptured ectopic pregnancy is one in which tearing or bursting of a fallopian tube causes internal bleeding. Often, there is intense lower abdominal pain, and vaginal bleeding sometimes occurs. Having an ectopic  pregnancy can be life-threatening. If this dangerous condition is not treated, it can lead to blood loss, shock, or even death. What are the causes? The most common cause of this condition is damage to one of the fallopian tubes. A fallopian tube may be narrowed or blocked, and that keeps the fertilized egg from reaching the uterus. What increases the risk? This condition is more likely to develop in women of childbearing age who have different levels of risk. The levels of risk can be divided into three categories. High risk  You have gone through infertility treatment.  You have had an ectopic pregnancy before.  You have had surgery on the fallopian tubes, or another surgical procedure, such as an abortion.  You have had surgery to have the fallopian tubes tied (tubal ligation).  You have problems or diseases of the fallopian tubes.  You have been exposed to diethylstilbestrol (DES). This medicine was used until 1971, and it had effects on babies whose mothers took the medicine.  You become pregnant while using an IUD (intrauterine device) for birth control. Moderate risk  You have a history of infertility.  You have had an STI (sexually transmitted infection).  You have a history of pelvic inflammatory disease (PID).  You have scarring from endometriosis.  You have multiple sexual partners.  You smoke. Low risk  You have had pelvic surgery.  You use vaginal douches.  You became sexually active before age 67. What are the signs or symptoms? Common symptoms of this condition include normal pregnancy symptoms, such as missing a period, nausea, tiredness, abdominal pain, breast tenderness, and bleeding. However, ectopic pregnancy will have additional symptoms, such as:  Pain with intercourse.  Irregular vaginal bleeding or spotting.  Cramping or pain on one side or in the lower abdomen.  Fast heartbeat, low blood pressure, and sweating.  Passing out while having a  bowel movement. Symptoms of a ruptured ectopic pregnancy and internal bleeding may include:  Sudden, severe pain in the abdomen and pelvis.  Dizziness, weakness, light-headedness, or fainting.  Pain in the shoulder or neck area. How is this diagnosed? This condition is diagnosed by:  A pelvic exam to locate pain or a mass in the abdomen.  A pregnancy test. This blood test checks for the presence as well as the specific level of pregnancy hormone in the bloodstream.  Ultrasound. This is performed if a pregnancy test is positive. In this test, a probe is inserted into the vagina. The probe will detect a fetus, possibly in a location other than the uterus.  Taking a sample of uterus tissue (dilation and  curettage, or D&C).  Surgery to perform a visual exam of the inside of the abdomen using a thin, lighted tube that has a tiny camera on the end (laparoscope).  Culdocentesis. This procedure involves inserting a needle at the top of the vagina, behind the uterus. If blood is present in this area, it may indicate that a fallopian tube is torn. How is this treated? This condition is treated with medicine or surgery. Medicine  An injection of a medicine (methotrexate) may be given to cause the pregnancy tissue to be absorbed. This medicine may save your fallopian tube. It may be given if: ? The diagnosis is made early, with no signs of active bleeding. ? The fallopian tube has not ruptured. ? You are considered to be a good candidate for the medicine. Usually, pregnancy hormone blood levels are checked after methotrexate treatment. This is to be sure that the medicine is effective. It may take 4-6 weeks for the pregnancy to be absorbed. Most pregnancies will be absorbed by 3 weeks. Surgery  A laparoscope may be used to remove the pregnancy tissue.  If severe internal bleeding occurs, a larger cut (incision) may be made in the lower abdomen (laparotomy) to remove the fetus and placenta.  This is done to stop the bleeding.  Part or all of the fallopian tube may be removed (salpingectomy) along with the fetus and placenta. The fallopian tube may also be repaired during the surgery.  In very rare circumstances, removal of the uterus (hysterectomy) may be required.  After surgery, pregnancy hormone testing may be done to be sure that there is no pregnancy tissue left. Whether your treatment is medicine or surgery, you may receive a Rho (D) immune globulin shot to prevent problems with any future pregnancy. This shot may be given if:  You are Rh-negative and the baby's father is Rh-positive.  You are Rh-negative and you do not know the Rh type of the baby's father. Follow these instructions at home:  Rest and limit your activity after the procedure for as long as told by your health care provider.  Until your health care provider says that it is safe: ? Do not lift anything that is heavier than 10 lb (4.5 kg), or the limit that your health care provider tells you. ? Avoid physical exercise and any movement that requires effort (is strenuous).  To help prevent constipation: ? Eat a healthy diet that includes fruits, vegetables, and whole grains. ? Drink 6-8 glasses of water per day. Get help right away if:  You develop worsening pain that is not relieved by medicine.  You have: ? A fever or chills. ? Vaginal bleeding. ? Redness and swelling at the incision site. ? Nausea and vomiting.  You feel dizzy or weak.  You feel light-headed or you faint. This information is not intended to replace advice given to you by your health care provider. Make sure you discuss any questions you have with your health care provider. Document Revised: 01/30/2017 Document Reviewed: 09/19/2015 Elsevier Patient Education  [place].

## 2019-04-11 NOTE — MAU Note (Signed)
Pt up in BR stated she passed a lot of blood and feels dizy and like she is going to pass out. Pt diaphoretic as well. Assisted  Pt back to bed. See v/s. Notified n.Nugent,NP and she came to bedside. Pt felt better after a few minutes. Small amount of vag bleeding noted in toilet. Medicated pt with percocet.  afew minutes later pt needed to go to the BR again and passed some more blood and tissue(POC) as well. Pt stated the cramping felt better after that.

## 2019-04-12 LAB — CULTURE, OB URINE: Culture: NO GROWTH

## 2019-04-12 LAB — ABO/RH: ABO/RH(D): A POS

## 2019-04-13 ENCOUNTER — Other Ambulatory Visit: Payer: Self-pay

## 2019-04-13 ENCOUNTER — Other Ambulatory Visit: Payer: 59

## 2019-04-13 ENCOUNTER — Other Ambulatory Visit (INDEPENDENT_AMBULATORY_CARE_PROVIDER_SITE_OTHER): Payer: 59 | Admitting: *Deleted

## 2019-04-13 DIAGNOSIS — O209 Hemorrhage in early pregnancy, unspecified: Secondary | ICD-10-CM

## 2019-04-13 NOTE — Progress Notes (Signed)
Pt here for Stat HCG only

## 2019-04-13 NOTE — Addendum Note (Signed)
Addended by: Lorelle Gibbs L on: 04/13/2019 01:20 PM   Modules accepted: Orders

## 2019-04-14 ENCOUNTER — Telehealth: Payer: Self-pay

## 2019-04-14 LAB — BETA HCG QUANT (REF LAB): hCG Quant: 187 m[IU]/mL

## 2019-04-14 NOTE — Telephone Encounter (Signed)
-----   Message from Willodean Rosenthal, MD sent at 04/14/2019  3:43 PM EST ----- Please call pt. Her qHCG has fallen sufficiently She does not need further labs.   Thx,  Clh-S

## 2019-04-14 NOTE — Telephone Encounter (Signed)
Called pt to discuss lab results. Pt made aware that her hCG labs have fallen sufficiently and she does not need to have any further labs. Understanding was voiced.  Jonna Dittrich l Quinntin Malter, CMA

## 2019-04-26 ENCOUNTER — Ambulatory Visit: Payer: 59 | Admitting: Advanced Practice Midwife

## 2021-08-02 IMAGING — US US OB < 14 WEEKS - US OB TV
1 series · 15 of 28 positions shown · non-contrast
Comparison: 09/14/2018

CLINICAL DATA: Heavy vaginal bleeding and positive urine pregnancy
test. Quantitative beta HCG is pending. LMP was 02/16/2019. EDC by
LMP is 11/23/2019. By LMP patient is 7 weeks 5 days. Gravida 2 AB1.

EXAM:
OBSTETRIC <14 WK US AND TRANSVAGINAL OB US
TECHNIQUE: Both transabdominal and transvaginal ultrasound examinations were
performed for complete evaluation of the gestation as well as the
maternal uterus, adnexal regions, and pelvic cul-de-sac.
Transvaginal technique was performed to assess early pregnancy.

[Series 1: us ob < 14 weeks - us ob tv · 15 of 48 slices shown]
[im 1/48]
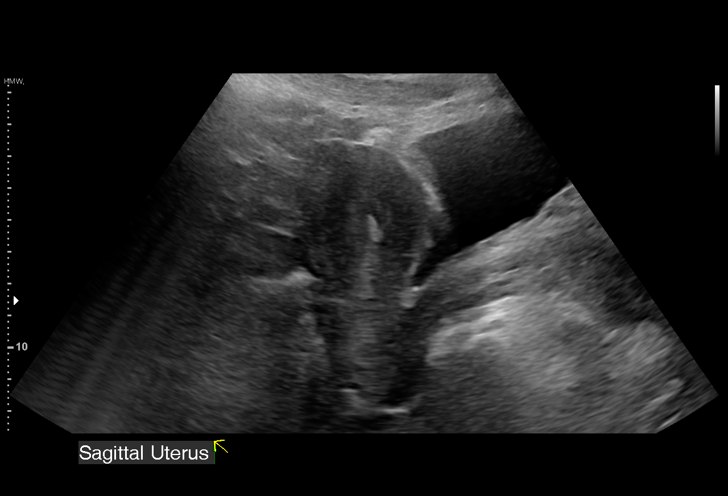
[im 4/48]
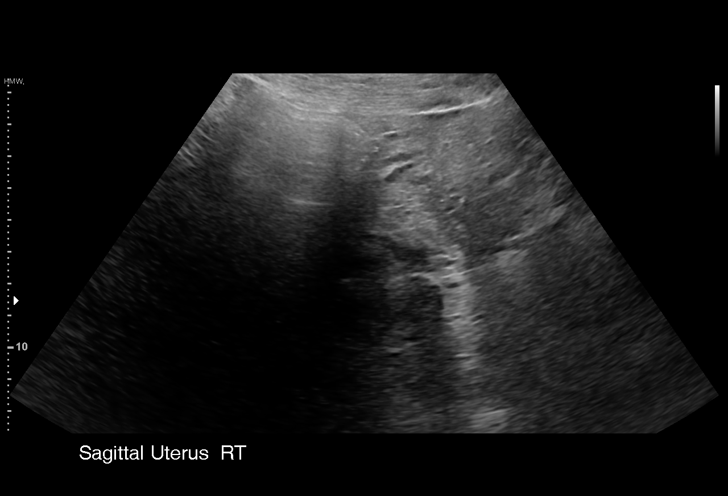
[im 7/48]
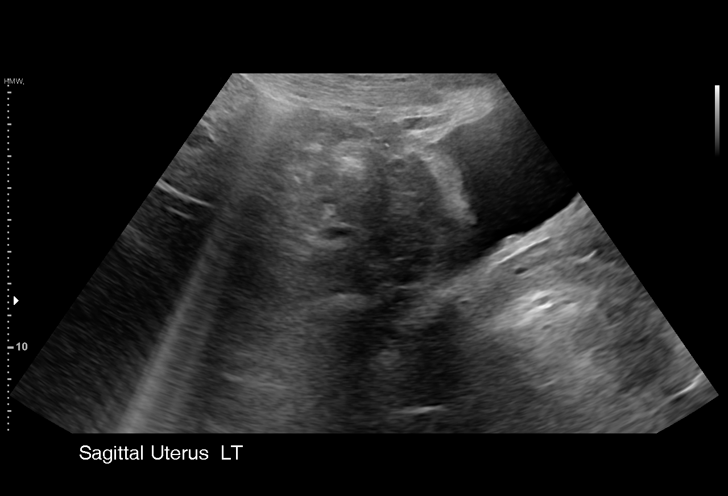
[im 11/48]
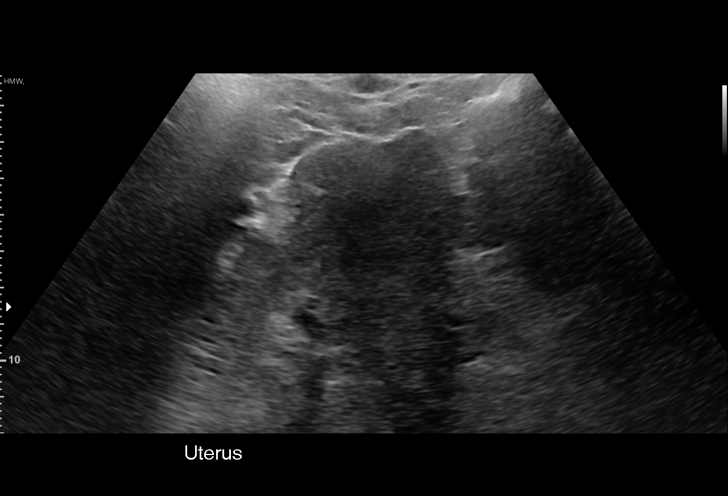
[im 14/48]
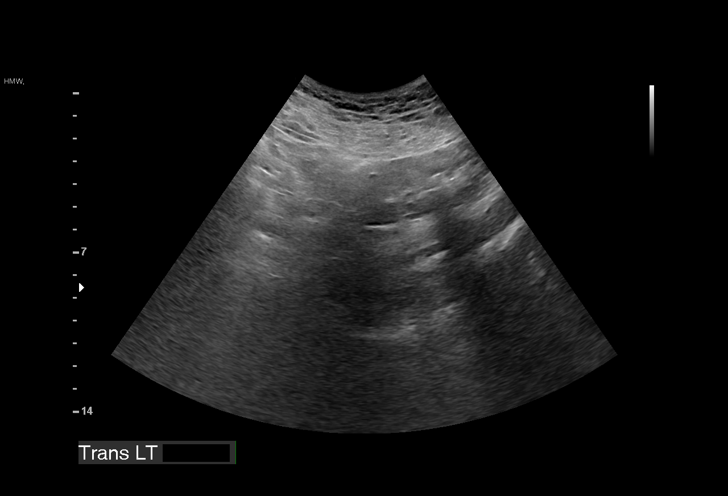
[im 18/48]
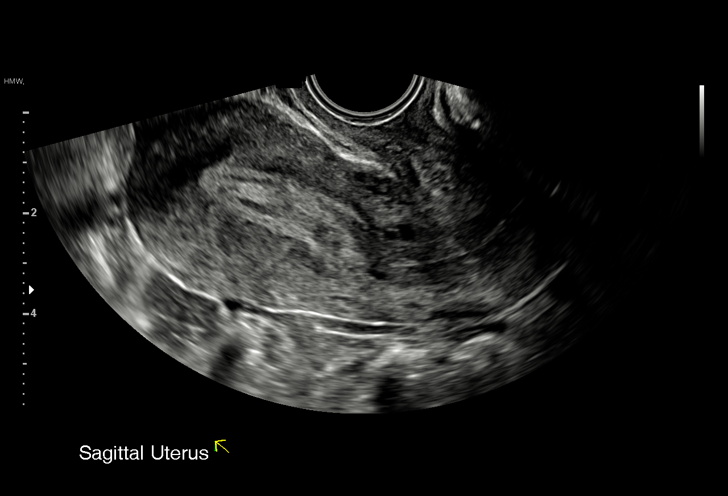
[im 21/48]
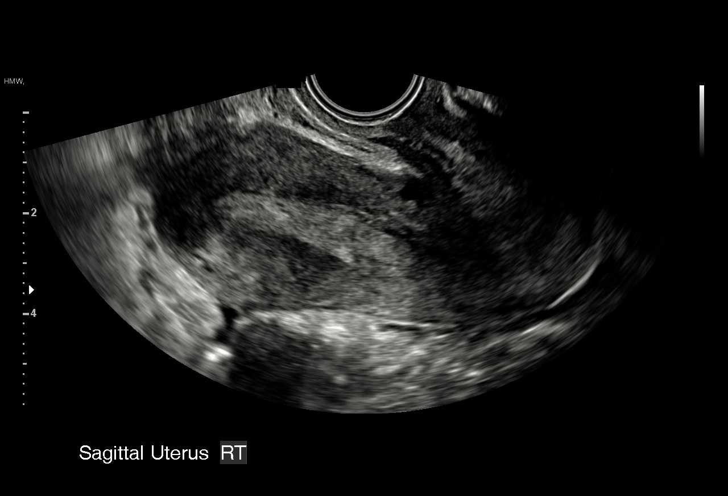
[im 25/48]
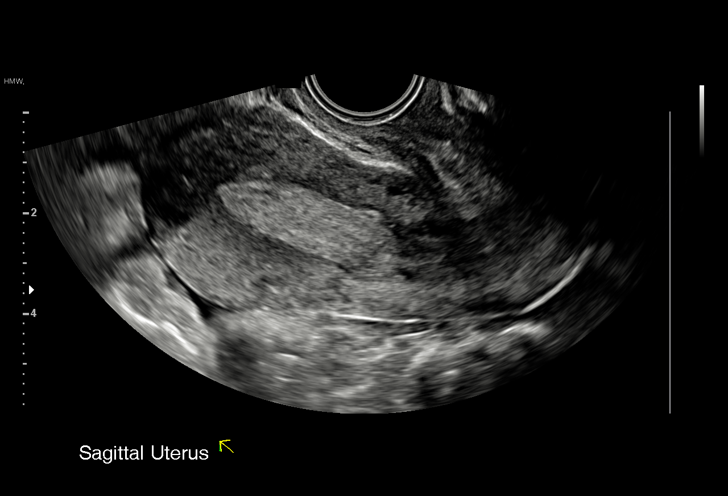
[im 27/48]
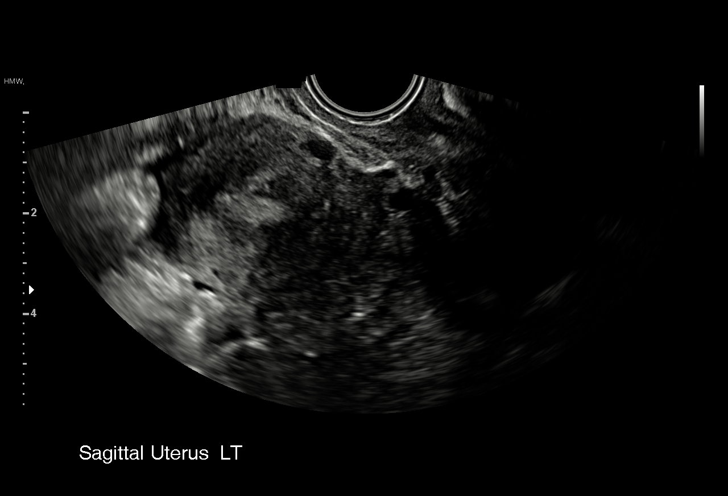
[im 30/48]
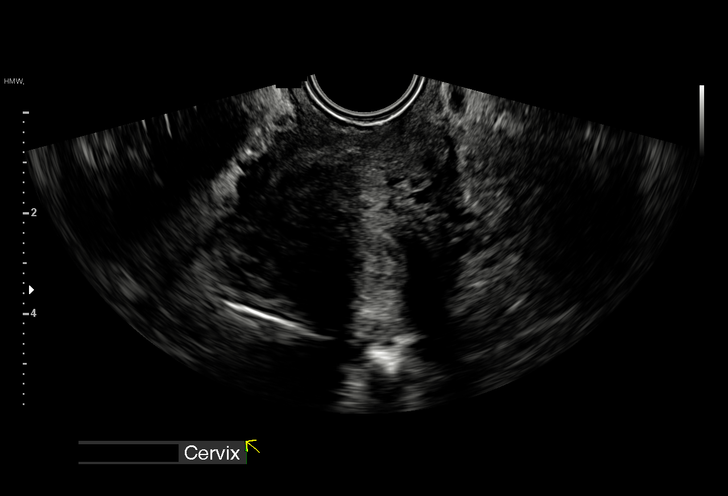
[im 34/48]
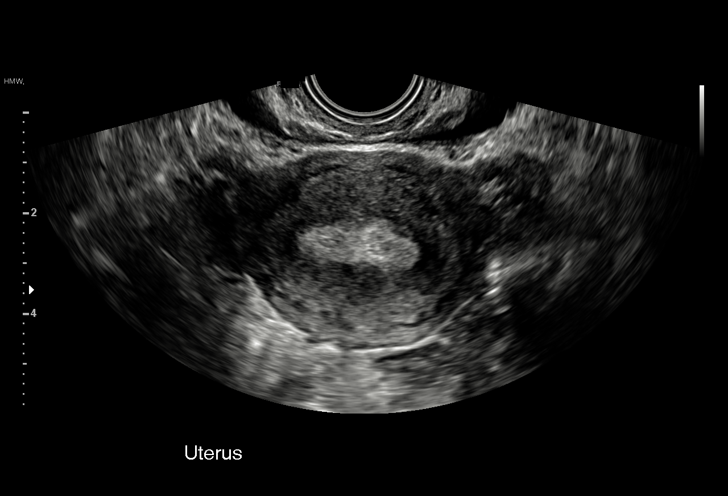
[im 37/48]
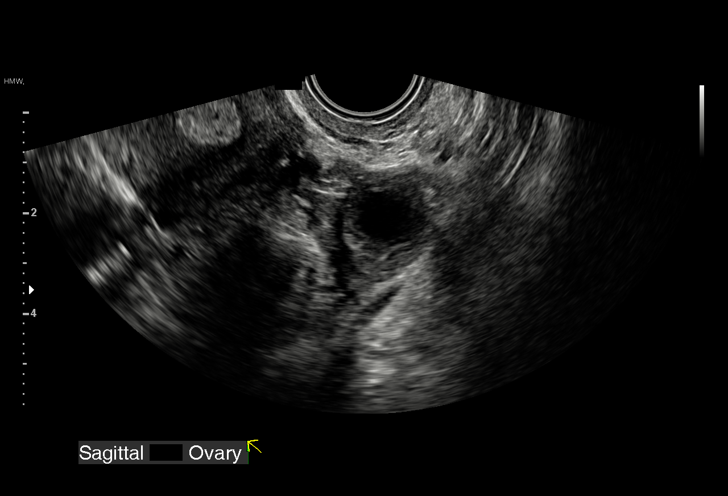
[im 41/48]
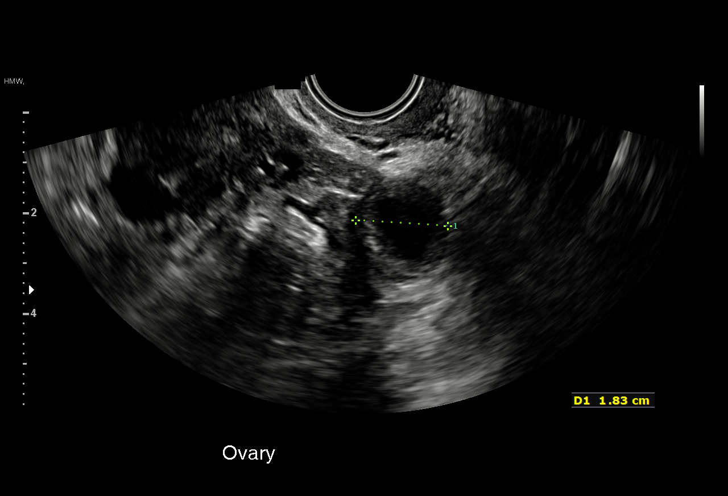
[im 44/48]
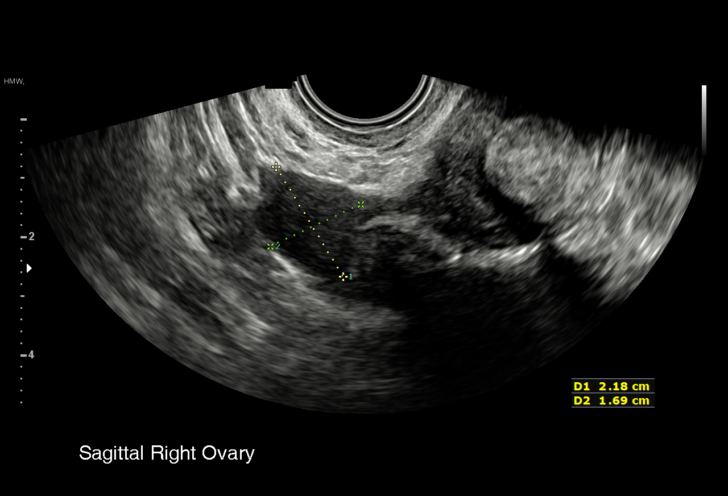
[im 48/48]
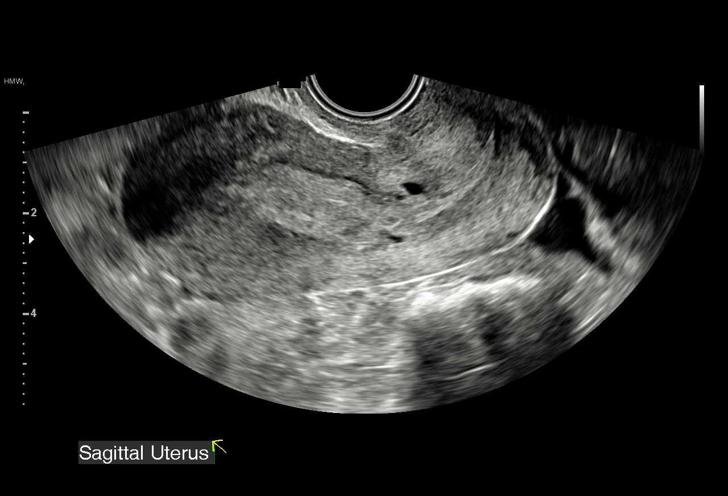

[15 of 28 positions shown; findings below may reference images not displayed]

FINDINGS: Intrauterine gestational sac: None

Yolk sac:  Not Visualized.

Embryo:  Not Visualized.

Cardiac Activity: Not Visualized.

Subchorionic hemorrhage:  None visualized.

Maternal uterus/adnexae: Endometrium is homogeneously thickened, 13
millimeters in thickness. The ovaries are normal in appearance.
Trace amount of free pelvic fluid.
IMPRESSION: 1. Pregnancy of unknown location.
2. Considerations include completed spontaneous abortion, early
intrauterine pregnancy or early ectopic pregnancy.
3. Serial quantitative beta HCG values and follow-up ultrasound are
recommended as appropriate to document progression of and location
of pregnancy.
4. Ectopic pregnancy has not been excluded.

## 2022-02-15 ENCOUNTER — Encounter (HOSPITAL_BASED_OUTPATIENT_CLINIC_OR_DEPARTMENT_OTHER): Payer: Self-pay | Admitting: Emergency Medicine

## 2022-02-15 ENCOUNTER — Emergency Department (HOSPITAL_BASED_OUTPATIENT_CLINIC_OR_DEPARTMENT_OTHER)
Admission: EM | Admit: 2022-02-15 | Discharge: 2022-02-15 | Disposition: A | Discharge status: Home / Self Care | Payer: BC Managed Care – PPO | Attending: Emergency Medicine | Admitting: Emergency Medicine

## 2022-02-15 ENCOUNTER — Other Ambulatory Visit: Payer: Self-pay

## 2022-02-15 DIAGNOSIS — U071 COVID-19: Secondary | ICD-10-CM

## 2022-02-15 DIAGNOSIS — R112 Nausea with vomiting, unspecified: Secondary | ICD-10-CM | POA: Diagnosis present

## 2022-02-15 LAB — URINALYSIS, ROUTINE W REFLEX MICROSCOPIC
Bilirubin Urine: NEGATIVE
Glucose, UA: NEGATIVE mg/dL
Leukocytes,Ua: NEGATIVE
Nitrite: NEGATIVE
Specific Gravity, Urine: 1.014 (ref 1.005–1.030)
pH: 7 (ref 5.0–8.0)

## 2022-02-15 LAB — CBC
HCT: 37.2 % (ref 36.0–46.0)
Hemoglobin: 13.2 g/dL (ref 12.0–15.0)
MCH: 29.6 pg (ref 26.0–34.0)
MCHC: 35.5 g/dL (ref 30.0–36.0)
MCV: 83.4 fL (ref 80.0–100.0)
Platelets: 141 10*3/uL — ABNORMAL LOW (ref 150–400)
RBC: 4.46 MIL/uL (ref 3.87–5.11)
RDW: 12.4 % (ref 11.5–15.5)
WBC: 4.5 10*3/uL (ref 4.0–10.5)
nRBC: 0 % (ref 0.0–0.2)

## 2022-02-15 LAB — COMPREHENSIVE METABOLIC PANEL
ALT: 24 U/L (ref 0–44)
AST: 86 U/L — ABNORMAL HIGH (ref 15–41)
Albumin: 4.6 g/dL (ref 3.5–5.0)
Alkaline Phosphatase: 75 U/L (ref 38–126)
Anion gap: 12 (ref 5–15)
BUN: 10 mg/dL (ref 6–20)
CO2: 22 mmol/L (ref 22–32)
Calcium: 9.1 mg/dL (ref 8.9–10.3)
Chloride: 103 mmol/L (ref 98–111)
Creatinine, Ser: 0.62 mg/dL (ref 0.44–1.00)
GFR, Estimated: >60 mL/min (ref >60)
Glucose, Bld: 141 mg/dL — ABNORMAL HIGH (ref 70–99)
Potassium: 3.8 mmol/L (ref 3.5–5.1)
Sodium: 137 mmol/L (ref 135–145)
Total Bilirubin: 0.3 mg/dL (ref 0.3–1.2)
Total Protein: 7.6 g/dL (ref 6.5–8.1)

## 2022-02-15 LAB — RESP PANEL BY RT-PCR (RSV, FLU A&B, COVID)  RVPGX2
Influenza A by PCR: NEGATIVE
Influenza B by PCR: NEGATIVE
Resp Syncytial Virus by PCR: NEGATIVE
SARS Coronavirus 2 by RT PCR: POSITIVE — AB

## 2022-02-15 LAB — LIPASE, BLOOD: Lipase: <10 U/L — ABNORMAL LOW (ref 11–51)

## 2022-02-15 LAB — HCG, SERUM, QUALITATIVE: Preg, Serum: NEGATIVE

## 2022-02-15 MED ORDER — ONDANSETRON HCL 4 MG/2ML IJ SOLN
4.0000 mg | Freq: Once | INTRAMUSCULAR | Status: AC
  Administered 2022-02-15: 4 mg via INTRAVENOUS
  Filled 2022-02-15: qty 2

## 2022-02-15 MED ORDER — ONDANSETRON HCL 4 MG/2ML IJ SOLN
4.0000 mg | Freq: Once | INTRAMUSCULAR | Status: AC | PRN
  Administered 2022-02-15: 4 mg via INTRAVENOUS
  Filled 2022-02-15: qty 2

## 2022-02-15 MED ORDER — SODIUM CHLORIDE 0.9 % IV BOLUS
1000.0000 mL | Freq: Once | INTRAVENOUS | Status: AC
  Administered 2022-02-15: 1000 mL via INTRAVENOUS

## 2022-02-15 MED ORDER — ONDANSETRON 4 MG PO TBDP: 4.0000 mg | ORAL_TABLET | Freq: Three times a day (TID) | ORAL | 0 refills | Status: AC | PRN

## 2022-02-15 MED ORDER — ALUM & MAG HYDROXIDE-SIMETH 200-200-20 MG/5ML PO SUSP
30.0000 mL | Freq: Once | ORAL | Status: AC
  Administered 2022-02-15: 30 mL via ORAL
  Filled 2022-02-15: qty 30

## 2022-02-15 NOTE — ED Provider Notes (Signed)
[facility] EMERGENCY DEPT Provider Note   CSN: [id number] Arrival date & time: 02/15/22  1101     History  Chief Complaint  Patient presents with   Emesis   Nausea    Sierra Carter is a 36 y.o. female.  Patient with no pertinent past medical history presents today with complaints of nausea and vomiting. She states that on Tuesday of this past week she woke up with a headache and congestion. States that throughout the day she developed bodyaches as well. States that the headache and bodyaches have since resolved, however yesterday morning she woke up with nausea and vomiting that has persisted into today.  States that she has had greater than 10 episodes of nonbloody, non-bilious emesis that persisted until she received Zofran in triage here today.  She denies any abdominal pain or diarrhea.  States that she has been having normal bowel movements.  Denies fevers, chills, chest pain, or shortness of breath.  No known sick contacts.  The history is provided by the patient. No language interpreter was used.  Emesis      Home Medications Prior to Admission medications   Medication Sig Start Date End Date Taking? Authorizing Provider  calcium carbonate (TUMS - DOSED IN MG ELEMENTAL CALCIUM) 500 MG chewable tablet Chew 1 tablet by mouth 2 (two) times daily as needed for indigestion or heartburn.    [provider]  Doxylamine-Pyridoxine 10-10 MG TBEC Take 1 tablet by mouth 2 (two) times daily as needed (nausea). 09/14/18   Sabas Sous, MD  famotidine (PEPCID) 20 MG tablet Take 20 mg by mouth daily.    [provider]  ondansetron (ZOFRAN ODT) 4 MG disintegrating tablet Take 1 tablet (4 mg total) by mouth every 8 (eight) hours as needed for nausea or vomiting. 09/14/18   Sabas Sous, MD  oxyCODONE-acetaminophen (PERCOCET) 5-325 MG tablet Take 1-2 tablets by mouth every 6 (six) hours as needed for up to 8 doses for severe pain. 04/11/19   Nugent, Odie Sera, NP  promethazine (PHENERGAN) 25 MG tablet Take 1 tablet (25 mg total) by mouth every 6 (six) hours as needed for nausea or vomiting. 03/08/15   Azalia Bilis, MD  ranitidine (ZANTAC) 150 MG tablet Take 150 mg by mouth daily as needed for heartburn.    [provider]  sertraline (ZOLOFT) 100 MG tablet Take 100 mg by mouth daily.    [provider]      Allergies    Patient has no known allergies.    Review of Systems   Review of Systems  Gastrointestinal:  Positive for nausea and vomiting.  All other systems reviewed and are negative.   Physical Exam Updated Vital Signs BP 125/69 (BP Location: Right Arm)   Pulse (!) 52   Temp 97.9 F (36.6 C)   Resp 18   Ht 5\' 6"  (1.676 m)   Wt 83.9 kg   SpO2 99%   BMI 29.86 kg/m  Physical Exam Vitals and nursing note reviewed.  Constitutional:      General: She is not in acute distress.    Appearance: Normal appearance. She is normal weight. She is not ill-appearing, toxic-appearing or diaphoretic.  HENT:     Head: Normocephalic and atraumatic.  Cardiovascular:     Rate and Rhythm: Normal rate and regular rhythm.     Heart sounds: Normal heart sounds.  Pulmonary:     Effort: Pulmonary effort is normal. No respiratory distress.  Breath sounds: Normal breath sounds.  Abdominal:     General: Abdomen is flat.     Palpations: Abdomen is soft.     Tenderness: There is no abdominal tenderness.  Musculoskeletal:        General: Normal range of motion.     Cervical back: Normal range of motion.  Skin:    General: Skin is warm and dry.  Neurological:     General: No focal deficit present.     Mental Status: She is alert.  Psychiatric:        Mood and Affect: Mood normal.        Behavior: Behavior normal.     ED Results / Procedures / Treatments   Labs (all labs ordered are listed, but only abnormal results are displayed) Labs Reviewed  RESP PANEL BY RT-PCR (RSV, FLU A&B, COVID)  RVPGX2 - Abnormal; Notable for  the following components:      Result Value   SARS Coronavirus 2 by RT PCR POSITIVE (*)    All other components within normal limits  LIPASE, BLOOD - Abnormal; Notable for the following components:   Lipase <10 (*)    All other components within normal limits  COMPREHENSIVE METABOLIC PANEL - Abnormal; Notable for the following components:   Glucose, Bld 141 (*)    AST 86 (*)    All other components within normal limits  CBC - Abnormal; Notable for the following components:   Platelets 141 (*)    All other components within normal limits  HCG, SERUM, QUALITATIVE  URINALYSIS, ROUTINE W REFLEX MICROSCOPIC    EKG None  Radiology No results found.  Procedures Procedures    Medications Ordered in ED Medications  ondansetron (ZOFRAN) injection 4 mg (4 mg Intravenous Given 02/15/22 1127)  sodium chloride 0.9 % bolus 1,000 mL (1,000 mLs Intravenous New Bag/Given 02/15/22 1310)    ED Course/ Medical Decision Making/ A&P                           Medical Decision Making Amount and/or Complexity of Data Reviewed Labs: ordered.  Risk Prescription drug management.   This patient is a 36 y.o. female who presents to the ED for concern of nausea and vomiting, this involves an extensive number of treatment options, and is a complaint that carries with it a high risk of complications and morbidity. The emergent differential diagnosis prior to evaluation includes, but is not limited to,  The differential diagnosis for generalized abdominal pain includes, but is not limited to gastroenteritis, appendicitis, Bowel obstruction, Gastroparesis, DKA,  Inflammatory bowel disease, URI  This is not an exhaustive differential.   Past Medical History / Co-morbidities / Social History: none  Physical Exam: Physical exam performed. The pertinent findings include: Abdomen soft and nontender  Lab Tests: I ordered, and personally interpreted labs.  The pertinent results include: COVID-positive.   No leukocytosis or anemia.  No electrolyte abnormalities.  No other acute laboratory findings.    Medications: I ordered medication including Zofran and fluids for nausea, vomiting, and dehydration. Reevaluation of the patient after these medicines showed that the patient improved. I have reviewed the patients home medicines and have made adjustments as needed.    Disposition:  Patient presents today with nausea and vomiting x 1 day.  Patient is nontoxic, nonseptic appearing, in no apparent distress.  Physical exam reveals abdomen that is soft and nontender.  After given Zofran and triage, she has not  had any subsequent episodes of nausea or vomiting.  Fluid bolus given.  Labs and vitals reviewed.  Patient does not meet the SIRS or Sepsis criteria.  On repeat exam patient does not have a surgical abdomin and there are no peritoneal signs.  No indication of appendicitis, bowel obstruction, bowel perforation, cholecystitis, diverticulitis, PID or ectopic pregnancy.  Patient found to be COVID-positive, suspect this is the etiology of her symptoms.  She is now able to tolerate oral hydration without any subsequent episodes of nausea or vomiting.  No further emergent concerns, patient is stable to be discharged.  Patient discharged home with Zofran for symptomatic treatment and given strict instructions for follow-up with their primary care physician.  I have also discussed reasons to return immediately to the ER.  Patient expresses understanding and agrees with plan.  Patient discharged in stable condition.   Final Clinical Impression(s) / ED Diagnoses Final diagnoses:  COVID  Nausea and vomiting, unspecified vomiting type    Rx / DC Orders ED Discharge Orders          Ordered    Fluid Challenge        02/15/22 1318    ondansetron (ZOFRAN-ODT) 4 MG disintegrating tablet  Every 8 hours PRN        02/15/22 1525          An After Visit Summary was printed and given to the patient.      Vear Clock 02/15/22 1528    Arby Barrette, MD 02/17/22 1344

## 2022-02-15 NOTE — Discharge Instructions (Signed)
As we discussed, you tested positive for COVID today.  I suspect that this is the cause of your symptoms.  As this is a viral illness, no antibiotics are indicated.  Management is focused more on supportive care which includes plenty of oral hydration and Tylenol/ibuprofen for pain and fevers.  I have given you a prescription for Zofran which is a antinausea medication for you to take as needed.  Follow-up with your primary care doctor for your additional health needs.  Additionally, please be advised that the most recent CDC guidelines recommend 5 days of quarantine followed by 5 days of mask wearing after a positive COVID test.  I recommend that you abide by these guidelines.  Return if development of any new or worsening symptoms.

## 2022-02-15 NOTE — ED Triage Notes (Signed)
Pt arrives to ED with c/o emesis, nausea. She notes emesis >12hrs w/ >10 episodes.

## 2022-02-15 NOTE — ED Notes (Signed)
Patient verbalizes understanding of discharge instructions. Opportunity for questioning and answers were provided. Patient discharged from ED.  °

## 2022-02-20 ENCOUNTER — Encounter (HOSPITAL_BASED_OUTPATIENT_CLINIC_OR_DEPARTMENT_OTHER): Payer: Self-pay | Admitting: Emergency Medicine

## 2022-02-20 ENCOUNTER — Other Ambulatory Visit: Payer: Self-pay

## 2022-02-20 ENCOUNTER — Other Ambulatory Visit (HOSPITAL_BASED_OUTPATIENT_CLINIC_OR_DEPARTMENT_OTHER): Payer: Self-pay

## 2022-02-20 ENCOUNTER — Emergency Department (HOSPITAL_BASED_OUTPATIENT_CLINIC_OR_DEPARTMENT_OTHER)
Admission: EM | Admit: 2022-02-20 | Discharge: 2022-02-20 | Disposition: A | Discharge status: Home / Self Care | Payer: BC Managed Care – PPO | Attending: Emergency Medicine | Admitting: Emergency Medicine

## 2022-02-20 ENCOUNTER — Emergency Department (HOSPITAL_BASED_OUTPATIENT_CLINIC_OR_DEPARTMENT_OTHER): Payer: BC Managed Care – PPO

## 2022-02-20 DIAGNOSIS — R7401 Elevation of levels of liver transaminase levels: Secondary | ICD-10-CM | POA: Diagnosis not present

## 2022-02-20 DIAGNOSIS — R112 Nausea with vomiting, unspecified: Secondary | ICD-10-CM | POA: Diagnosis present

## 2022-02-20 DIAGNOSIS — U071 COVID-19: Secondary | ICD-10-CM | POA: Insufficient documentation

## 2022-02-20 LAB — COMPREHENSIVE METABOLIC PANEL
ALT: 86 U/L — ABNORMAL HIGH (ref 0–44)
AST: 115 U/L — ABNORMAL HIGH (ref 15–41)
Albumin: 4.3 g/dL (ref 3.5–5.0)
Alkaline Phosphatase: 93 U/L (ref 38–126)
Anion gap: 14 (ref 5–15)
BUN: 10 mg/dL (ref 6–20)
CO2: 22 mmol/L (ref 22–32)
Calcium: 9.2 mg/dL (ref 8.9–10.3)
Chloride: 103 mmol/L (ref 98–111)
Creatinine, Ser: 0.75 mg/dL (ref 0.44–1.00)
GFR, Estimated: >60 mL/min (ref >60)
Glucose, Bld: 106 mg/dL — ABNORMAL HIGH (ref 70–99)
Potassium: 3.3 mmol/L — ABNORMAL LOW (ref 3.5–5.1)
Sodium: 139 mmol/L (ref 135–145)
Total Bilirubin: 0.8 mg/dL (ref 0.3–1.2)
Total Protein: 7 g/dL (ref 6.5–8.1)

## 2022-02-20 LAB — URINALYSIS, ROUTINE W REFLEX MICROSCOPIC
Bilirubin Urine: NEGATIVE
Glucose, UA: NEGATIVE mg/dL
Ketones, ur: 15 mg/dL — AB
Nitrite: NEGATIVE
Protein, ur: 30 mg/dL — AB
Specific Gravity, Urine: 1.03 (ref 1.005–1.030)
Squamous Epithelial / HPF: >50 — ABNORMAL HIGH (ref 0–5)
pH: 6 (ref 5.0–8.0)

## 2022-02-20 LAB — LIPASE, BLOOD: Lipase: <10 U/L — ABNORMAL LOW (ref 11–51)

## 2022-02-20 LAB — CBC
HCT: 37.4 % (ref 36.0–46.0)
Hemoglobin: 13.4 g/dL (ref 12.0–15.0)
MCH: 29.6 pg (ref 26.0–34.0)
MCHC: 35.8 g/dL (ref 30.0–36.0)
MCV: 82.6 fL (ref 80.0–100.0)
Platelets: 179 10*3/uL (ref 150–400)
RBC: 4.53 MIL/uL (ref 3.87–5.11)
RDW: 12.3 % (ref 11.5–15.5)
WBC: 6.8 10*3/uL (ref 4.0–10.5)
nRBC: 0 % (ref 0.0–0.2)

## 2022-02-20 LAB — PREGNANCY, URINE: Preg Test, Ur: NEGATIVE

## 2022-02-20 MED ORDER — SODIUM CHLORIDE 0.9 % IV BOLUS
1000.0000 mL | Freq: Once | INTRAVENOUS | Status: AC
  Administered 2022-02-20: 1000 mL via INTRAVENOUS

## 2022-02-20 MED ORDER — LORAZEPAM 2 MG/ML IJ SOLN
1.0000 mg | Freq: Once | INTRAMUSCULAR | Status: AC
  Administered 2022-02-20: 1 mg via INTRAVENOUS
  Filled 2022-02-20: qty 1

## 2022-02-20 MED ORDER — PROMETHAZINE HCL 25 MG PO TABS
25.0000 mg | ORAL_TABLET | Freq: Four times a day (QID) | ORAL | 0 refills | Status: AC | PRN
  Filled 2022-02-20: qty 30, 8d supply, fill #0

## 2022-02-20 MED ORDER — DIPHENHYDRAMINE HCL 50 MG/ML IJ SOLN
12.5000 mg | Freq: Once | INTRAMUSCULAR | Status: AC
  Administered 2022-02-20: 12.5 mg via INTRAVENOUS
  Filled 2022-02-20: qty 1

## 2022-02-20 MED ORDER — IOHEXOL 300 MG/ML  SOLN
100.0000 mL | Freq: Once | INTRAMUSCULAR | Status: AC | PRN
  Administered 2022-02-20: 85 mL via INTRAVENOUS

## 2022-02-20 MED ORDER — SODIUM CHLORIDE 0.9 % IV SOLN
12.5000 mg | Freq: Four times a day (QID) | INTRAVENOUS | Status: DC | PRN
  Filled 2022-02-20: qty 0.5

## 2022-02-20 MED ORDER — DIPHENHYDRAMINE HCL 25 MG PO TABS
25.0000 mg | ORAL_TABLET | Freq: Four times a day (QID) | ORAL | 0 refills | Status: DC | PRN
  Filled 2022-02-20: qty 30, 8d supply, fill #0

## 2022-02-20 MED ORDER — ONDANSETRON HCL 4 MG/2ML IJ SOLN: 4.0000 mg | Freq: Once | INTRAMUSCULAR | Status: DC | PRN

## 2022-02-20 NOTE — Discharge Instructions (Addendum)
Your symptoms are likely still due to the COVID infection.  I have written you for the nausea medicine.  May also take Benadryl with it as we discussed in the room.   Follow-up with primary care provider  Return for new or worsening symptoms.

## 2022-02-20 NOTE — ED Provider Notes (Signed)
[facility]   CSN: [id number] Arrival date & time: 02/20/22  [id number]     History  Chief Complaint  Patient presents with   Emesis    Sierra Carter is a 36 y.o. female history of anxiety here for evaluation of persistent nausea and vomiting.  Was seen here in the emergency department 5 days ago for similar.  Diagnosed with COVID at that time.  Patient states she continues to have persistent nausea and vomiting, unable to keep down home meds and even ice chips.  Some generalized abdominal cramping however no dysuria or hematuria.  Denies chance of pregnancy.  Having some intermittent chills.  Rhinorrhea.  No chest pain, shortness of breath, back pain.  No vaginal bleeding.  Attempting Zofran ODT at home without relief. Cannot keep down her home anxiety meds which has cause her anxiety to increase. Take 4 daily PRN. No changes in stools. Some gen weakness which she relates to decreased PO intake.  HPI     Home Medications Prior to Admission medications   Medication Sig Start Date End Date Taking? Authorizing Provider  diphenhydrAMINE (BENADRYL) 25 MG tablet Take 1 tablet (25 mg total) by mouth every 6 (six) hours as needed. 02/20/22  Yes Rayelle Armor A, PA-C  promethazine (PHENERGAN) 25 MG tablet Take 1 tablet (25 mg total) by mouth every 6 (six) hours as needed for nausea or vomiting. 02/20/22  Yes Juanna Pudlo A, PA-C  ALPRAZolam (XANAX) 0.5 MG tablet Take 0.25-0.5 mg by mouth 2 (two) times daily as needed. 01/20/22   [provider]  calcium carbonate (TUMS - DOSED IN MG ELEMENTAL CALCIUM) 500 MG chewable tablet Chew 1 tablet by mouth 2 (two) times daily as needed for indigestion or heartburn.    [provider]  Doxylamine-Pyridoxine 10-10 MG TBEC Take 1 tablet by mouth 2 (two) times daily as needed (nausea). 09/14/18   Sabas SousBero, Michael M, MD  famotidine (PEPCID) 20 MG tablet Take 20 mg by mouth daily.    [provider]  ondansetron (ZOFRAN-ODT) 4 MG disintegrating tablet Take 1 tablet (4 mg total) by mouth every 8 (eight) hours as needed for nausea or vomiting. 02/15/22   Smoot, Shawn RouteSarah A, PA-C  oxyCODONE-acetaminophen (PERCOCET) 5-325 MG tablet Take 1-2 tablets by mouth every 6 (six) hours as needed for up to 8 doses for severe pain. 04/11/19   Nugent, Odie SeraNicole E, NP  ranitidine (ZANTAC) 150 MG tablet Take 150 mg by mouth daily as needed for heartburn.    [provider]  sertraline (ZOLOFT) 100 MG tablet Take 100 mg by mouth daily.    [provider]      Allergies    Patient has no known allergies.    Review of Systems   Review of Systems  Constitutional: Negative.   HENT: Negative.    Respiratory: Negative.    Cardiovascular: Negative.   Gastrointestinal:  Positive for abdominal pain, nausea and vomiting. Negative for abdominal distention, anal bleeding, blood in stool, constipation, diarrhea and rectal pain.  Genitourinary: Negative.   Musculoskeletal: Negative.   Skin: Negative.   Neurological:  Positive for weakness (generalized).  Psychiatric/Behavioral:  The patient is nervous/anxious.   All other systems reviewed and are negative.   Physical Exam Updated Vital Signs BP (!) 116/57   Pulse (!) 52   Temp 98.5 F (36.9 C) (Oral)   Resp 18   SpO2 98%  Physical Exam Vitals and nursing note reviewed.  Constitutional:  General: She is not in acute distress.    Appearance: She is well-developed. She is not ill-appearing, toxic-appearing or diaphoretic.  HENT:     Head: Normocephalic and atraumatic.     Nose: Nose normal.     Mouth/Throat:     Mouth: Mucous membranes are moist.  Eyes:     Pupils: Pupils are equal, round, and reactive to light.  Cardiovascular:     Rate and Rhythm: Normal rate.     Pulses: Normal pulses.     Heart sounds: Normal heart sounds.  Pulmonary:     Effort: Pulmonary effort is normal. No respiratory distress.     Breath  sounds: Normal breath sounds.  Abdominal:     General: Bowel sounds are normal. There is no distension.     Palpations: Abdomen is soft.     Tenderness: There is generalized abdominal tenderness. There is no right CVA tenderness, left CVA tenderness, guarding or rebound. Negative signs include Murphy's sign and McBurney's sign.     Hernia: No hernia is present.     Comments: Soft, generalized tenderness  Musculoskeletal:        General: Normal range of motion.     Cervical back: Normal range of motion.  Skin:    General: Skin is warm and dry.     Capillary Refill: Capillary refill takes less than 2 seconds.  Neurological:     General: No focal deficit present.     Mental Status: She is alert.  Psychiatric:        Mood and Affect: Mood is anxious.        Speech: Speech normal.        Behavior: Behavior is cooperative.        Thought Content: Thought content normal.     Comments: Anxious, tremulous    ED Results / Procedures / Treatments   Labs (all labs ordered are listed, but only abnormal results are displayed) Labs Reviewed  LIPASE, BLOOD - Abnormal; Notable for the following components:      Result Value   Lipase <10 (*)    All other components within normal limits  COMPREHENSIVE METABOLIC PANEL - Abnormal; Notable for the following components:   Potassium 3.3 (*)    Glucose, Bld 106 (*)    AST 115 (*)    ALT 86 (*)    All other components within normal limits  URINALYSIS, ROUTINE W REFLEX MICROSCOPIC - Abnormal; Notable for the following components:   APPearance HAZY (*)    Hgb urine dipstick SMALL (*)    Ketones, ur 15 (*)    Protein, ur 30 (*)    Leukocytes,Ua LARGE (*)    Bacteria, UA RARE (*)    Squamous Epithelial / LPF >50 (*)    All other components within normal limits  CBC  PREGNANCY, URINE    EKG None  Radiology CT ABDOMEN PELVIS W CONTRAST  Result Date: 02/20/2022 CLINICAL DATA:  Nausea and vomiting. EXAM: CT ABDOMEN AND PELVIS WITH CONTRAST  TECHNIQUE: Multidetector CT imaging of the abdomen and pelvis was performed using the standard protocol following bolus administration of intravenous contrast. RADIATION DOSE REDUCTION: This exam was performed according to the departmental dose-optimization program which includes automated exposure control, adjustment of the mA and/or kV according to patient size and/or use of iterative reconstruction technique. CONTRAST:  54mL OMNIPAQUE IOHEXOL 300 MG/ML  SOLN COMPARISON:  None Available. FINDINGS: Lower chest: The lung bases are clear. The imaged heart is unremarkable. Hepatobiliary: The  liver and gallbladder are unremarkable. There is no biliary ductal dilatation. Pancreas: Unremarkable. Spleen: Unremarkable. Adrenals/Urinary Tract: The adrenals are unremarkable. The kidneys are unremarkable, with no focal lesion, stone, hydronephrosis, or hydroureter. The bladder is unremarkable. Stomach/Bowel: The stomach is unremarkable. There is no evidence of bowel obstruction. There is no abnormal bowel wall thickening or inflammatory change. The appendix is normal. Vascular/Lymphatic: The abdominal aorta is normal in course and caliber. The major branch vessels are patent. The main portal and splenic veins are patent. There is no abdominopelvic lymphadenopathy. Reproductive: The uterus and ovaries are unremarkable. Other: There is small volume free fluid in the pelvis, nonspecific and within physiologic limits in a patient of this age. There is no upper abdominal ascites. There is no free intraperitoneal air. Musculoskeletal: There is no acute osseous abnormality or suspicious osseous lesion. IMPRESSION: No acute finding in the abdomen or pelvis. Electronically Signed   By: Lesia Hausen M.D.   On: 02/20/2022 11:45    Procedures Procedures    Medications Ordered in ED Medications  promethazine (PHENERGAN) 12.5 mg in sodium chloride 0.9 % 50 mL IVPB (has no administration in time range)  sodium chloride 0.9 % bolus  1,000 mL (0 mLs Intravenous Stopped 02/20/22 1202)  diphenhydrAMINE (BENADRYL) injection 12.5 mg (12.5 mg Intravenous Given 02/20/22 1044)  iohexol (OMNIPAQUE) 300 MG/ML solution 100 mL (85 mLs Intravenous Contrast Given 02/20/22 1122)  LORazepam (ATIVAN) injection 1 mg (1 mg Intravenous Given 02/20/22 1217)   ED Course/ Medical Decision Making/ A&P    36 year old here for evaluation of nausea and vomiting.  Seen approximately 5 days ago for similar.  Diagnosed with COVID at that time.  Since then she has been attempting ODT Zofran without relief, unable to keep down home meds or even ice chips.  Some generalized cramping however no overt pain.  No urinary symptoms.  Denies chance of pregnancy.  She is been having some chills without documented fever.  No chest pain or shortness of breath.  Will plan on labs, imaging, treat symptomatically and reassess  Labs and imaging personally viewed and interpreted:  CBC no leukocytosis CMP mild elevation LFTs Preg negative UA<10 CT AP without acute abnormality  Patient reassessed. Received IVF.  Morrie Sheldon declined Phenergan, requesting Ativan as she feels more anxious.  Discussed labs and imaging.  Will give dose of Ativan.  Of note she does take anxiety occasions 4 times daily at home which she has been unable to keep down.  Patient reassessed.  Feels significantly improved after 1 mg Ativan.  Will attempt p.o. challenge.  Patient reassessed.  She is able to tolerate p.o. intake.  Suspect COVID versus gastroenteritis versus Benzo wd as cause of her sx today. Will dc home with sx management.  Offered Phenergan prescription as she has had this previously and tolerated well.  Recommended suppository version given emesis however patient declined.  Will give p.o. version.  Patient does not meet the SIRS or Sepsis criteria.  On repeat exam patient does not have a surgical abdomin and there are no peritoneal signs.  No indication of appendicitis, bowel  obstruction, bowel perforation, cholecystitis, diverticulitis, PID, TOA, Torsions or ectopic pregnancy.   The patient has been appropriately medically screened and/or stabilized in the ED. I have low suspicion for any other emergent medical condition which would require further screening, evaluation or treatment in the ED or require inpatient management.  Patient is hemodynamically stable and in no acute distress.  Patient able to ambulate in department prior  to ED.  Evaluation does not show acute pathology that would require ongoing or additional emergent interventions while in the emergency department or further inpatient treatment.  I have discussed the diagnosis with the patient and answered all questions.  Pain is been managed while in the emergency department and patient has no further complaints prior to discharge.  Patient is comfortable with plan discussed in room and is stable for discharge at this time.  I have discussed strict return precautions for returning to the emergency department.  Patient was encouraged to follow-up with PCP/specialist refer to at discharge.                             Medical Decision Making Amount and/or Complexity of Data Reviewed External Data Reviewed: labs, radiology and notes. Labs: ordered. Decision-making details documented in ED Course. Radiology: ordered and independent interpretation performed. Decision-making details documented in ED Course.  Risk OTC drugs. Prescription drug management. Parenteral controlled substances. Decision regarding hospitalization. Diagnosis or treatment significantly limited by social determinants of health.          Final Clinical Impression(s) / ED Diagnoses Final diagnoses:  COVID  Nausea and vomiting, unspecified vomiting type    Rx / DC Orders ED Discharge Orders          Ordered    promethazine (PHENERGAN) 25 MG tablet  Every 6 hours PRN        02/20/22 1343    diphenhydrAMINE (BENADRYL) 25 MG  tablet  Every 6 hours PRN        02/20/22 1343              Thatiana Renbarger A, PA-C 02/20/22 1350    Rolan Bucco, MD 02/20/22 1451

## 2022-02-20 NOTE — ED Triage Notes (Signed)
Pt arrives to ED with c/o emesis and nausea that started yesterday. Was recently seen for same and dx with covid.

## 2022-02-20 NOTE — ED Notes (Signed)
Patient transported to CT 

## 2023-06-01 ENCOUNTER — Emergency Department (HOSPITAL_BASED_OUTPATIENT_CLINIC_OR_DEPARTMENT_OTHER)
Admission: EM | Admit: 2023-06-01 | Discharge: 2023-06-01 | Disposition: A | Discharge status: Home / Self Care | Payer: Self-pay | Attending: Emergency Medicine | Admitting: Emergency Medicine

## 2023-06-01 ENCOUNTER — Encounter (HOSPITAL_BASED_OUTPATIENT_CLINIC_OR_DEPARTMENT_OTHER): Payer: Self-pay | Admitting: Urology

## 2023-06-01 ENCOUNTER — Other Ambulatory Visit: Payer: Self-pay

## 2023-06-01 DIAGNOSIS — F17210 Nicotine dependence, cigarettes, uncomplicated: Secondary | ICD-10-CM | POA: Insufficient documentation

## 2023-06-01 DIAGNOSIS — R112 Nausea with vomiting, unspecified: Secondary | ICD-10-CM

## 2023-06-01 DIAGNOSIS — F1290 Cannabis use, unspecified, uncomplicated: Secondary | ICD-10-CM

## 2023-06-01 DIAGNOSIS — R197 Diarrhea, unspecified: Secondary | ICD-10-CM | POA: Insufficient documentation

## 2023-06-01 LAB — COMPREHENSIVE METABOLIC PANEL WITH GFR
ALT: 32 U/L (ref 0–44)
AST: 68 U/L — ABNORMAL HIGH (ref 15–41)
Albumin: 4.2 g/dL (ref 3.5–5.0)
Alkaline Phosphatase: 70 U/L (ref 38–126)
Anion gap: 13 (ref 5–15)
BUN: 10 mg/dL (ref 6–20)
CO2: 21 mmol/L — ABNORMAL LOW (ref 22–32)
Calcium: 9.6 mg/dL (ref 8.9–10.3)
Chloride: 105 mmol/L (ref 98–111)
Creatinine, Ser: 0.77 mg/dL (ref 0.44–1.00)
GFR, Estimated: >60 mL/min (ref >60)
Glucose, Bld: 135 mg/dL — ABNORMAL HIGH (ref 70–99)
Potassium: 3.8 mmol/L (ref 3.5–5.1)
Sodium: 139 mmol/L (ref 135–145)
Total Bilirubin: 0.7 mg/dL (ref 0.0–1.2)
Total Protein: 7.6 g/dL (ref 6.5–8.1)

## 2023-06-01 LAB — URINALYSIS, ROUTINE W REFLEX MICROSCOPIC
Bilirubin Urine: NEGATIVE
Glucose, UA: NEGATIVE mg/dL
Ketones, ur: NEGATIVE mg/dL
Nitrite: NEGATIVE
Protein, ur: >=300 mg/dL — AB
Specific Gravity, Urine: 1.015 (ref 1.005–1.030)
pH: >=9 (ref 5.0–8.0)

## 2023-06-01 LAB — RAPID URINE DRUG SCREEN, HOSP PERFORMED
Amphetamines: NOT DETECTED
Barbiturates: NOT DETECTED
Benzodiazepines: POSITIVE — AB
Cocaine: NOT DETECTED
Opiates: NOT DETECTED
Tetrahydrocannabinol: POSITIVE — AB

## 2023-06-01 LAB — PREGNANCY, URINE: Preg Test, Ur: NEGATIVE

## 2023-06-01 LAB — CBC
HCT: 36.9 % (ref 36.0–46.0)
Hemoglobin: 13.2 g/dL (ref 12.0–15.0)
MCH: 30.1 pg (ref 26.0–34.0)
MCHC: 35.8 g/dL (ref 30.0–36.0)
MCV: 84.2 fL (ref 80.0–100.0)
Platelets: 185 10*3/uL (ref 150–400)
RBC: 4.38 MIL/uL (ref 3.87–5.11)
RDW: 12.4 % (ref 11.5–15.5)
WBC: 12.5 10*3/uL — ABNORMAL HIGH (ref 4.0–10.5)
nRBC: 0 % (ref 0.0–0.2)

## 2023-06-01 LAB — URINALYSIS, MICROSCOPIC (REFLEX): RBC / HPF: >50 RBC/hpf (ref 0–5)

## 2023-06-01 LAB — RESP PANEL BY RT-PCR (RSV, FLU A&B, COVID)  RVPGX2
Influenza A by PCR: NEGATIVE
Influenza B by PCR: NEGATIVE
Resp Syncytial Virus by PCR: NEGATIVE
SARS Coronavirus 2 by RT PCR: NEGATIVE

## 2023-06-01 LAB — LIPASE, BLOOD: Lipase: 19 U/L (ref 11–51)

## 2023-06-01 MED ORDER — SUCRALFATE 1 G PO TABS: 1.0000 g | ORAL_TABLET | Freq: Three times a day (TID) | ORAL | 0 refills | Status: AC

## 2023-06-01 MED ORDER — ONDANSETRON HCL 4 MG PO TABS: 4.0000 mg | ORAL_TABLET | ORAL | 0 refills | Status: AC | PRN

## 2023-06-01 MED ORDER — METOCLOPRAMIDE HCL 5 MG/ML IJ SOLN
5.0000 mg | Freq: Once | INTRAMUSCULAR | Status: AC
  Administered 2023-06-01: 5 mg via INTRAVENOUS
  Filled 2023-06-01: qty 2

## 2023-06-01 MED ORDER — ONDANSETRON HCL 4 MG/2ML IJ SOLN
4.0000 mg | Freq: Once | INTRAMUSCULAR | Status: AC | PRN
  Administered 2023-06-01: 4 mg via INTRAVENOUS
  Filled 2023-06-01: qty 2

## 2023-06-01 MED ORDER — DIPHENHYDRAMINE HCL 50 MG/ML IJ SOLN
25.0000 mg | Freq: Once | INTRAMUSCULAR | Status: AC
  Administered 2023-06-01: 25 mg via INTRAVENOUS
  Filled 2023-06-01: qty 1

## 2023-06-01 MED ORDER — SODIUM CHLORIDE 0.9 % IV BOLUS
1000.0000 mL | Freq: Once | INTRAVENOUS | Status: AC
  Administered 2023-06-01: 1000 mL via INTRAVENOUS

## 2023-06-01 MED ORDER — HYDROXYZINE HCL 25 MG PO TABS: 25.0000 mg | ORAL_TABLET | Freq: Every evening | ORAL | 0 refills | Status: AC | PRN

## 2023-06-01 MED ORDER — ALUM & MAG HYDROXIDE-SIMETH 200-200-20 MG/5ML PO SUSP
15.0000 mL | Freq: Once | ORAL | Status: AC
  Administered 2023-06-01: 15 mL via ORAL
  Filled 2023-06-01: qty 30

## 2023-06-01 NOTE — ED Provider Notes (Signed)
 [facility] Provider Note  CSN: [id number] Arrival date & time: 06/01/23 1624  Chief Complaint(s) Emesis  HPI Sierra Carter is a 38 y.o. female with past medical history as below, significant for anxiety, insomnia who presents to the ED with complaint of n/ vomiting diarrhea  Symptoms began this morning, nausea vomiting diarrhea.  Poor p.o. intake since onset of symptoms secondary to ongoing nausea.  Has daily NSAID or daily alcohol, denies daily THC.  Emesis nonbloody nonbilious, diarrhea is nonmelanotic nonbloody.  No abdominal pain.  No fevers or chills, no sick contacts recent travel.  No recent antibiotics no suspicious p.o. intake  Past Medical History Past Medical History:  Diagnosis Date   Medical history non-contributory    There are no active problems to display for this patient.  Home Medication(s) Prior to Admission medications   Medication Sig Start Date End Date Taking? Authorizing Provider  hydrOXYzine (ATARAX) 25 MG tablet Take 1 tablet (25 mg total) by mouth at bedtime as needed (sleeplessness). 06/01/23  Yes Tanda Rockers A, DO  ondansetron (ZOFRAN) 4 MG tablet Take 1 tablet (4 mg total) by mouth every 4 (four) hours as needed for nausea or vomiting. 06/01/23  Yes Tanda Rockers A, DO  sucralfate (CARAFATE) 1 g tablet Take 1 tablet (1 g total) by mouth with breakfast, with lunch, and with evening meal for 7 days. 06/01/23 06/08/23 Yes Sloan Leiter, DO  ALPRAZolam Prudy Feeler) 0.5 MG tablet Take 0.25-0.5 mg by mouth 2 (two) times daily as needed. 01/20/22   [provider]  calcium carbonate (TUMS - DOSED IN MG ELEMENTAL CALCIUM) 500 MG chewable tablet Chew 1 tablet by mouth 2 (two) times daily as needed for indigestion or heartburn.    [provider]  famotidine (PEPCID) 20 MG tablet Take 20 mg by mouth daily.    [provider]  ondansetron (ZOFRAN-ODT) 4 MG disintegrating tablet Take 1 tablet (4 mg total)  by mouth every 8 (eight) hours as needed for nausea or vomiting. 02/15/22   Smoot, Shawn Route, PA-C  oxyCODONE-acetaminophen (PERCOCET) 5-325 MG tablet Take 1-2 tablets by mouth every 6 (six) hours as needed for up to 8 doses for severe pain. 04/11/19   Nugent, Odie Sera, NP  promethazine (PHENERGAN) 25 MG tablet Take 1 tablet (25 mg total) by mouth every 6 (six) hours as needed for nausea or vomiting. 02/20/22   Henderly, Britni A, PA-C  ranitidine (ZANTAC) 150 MG tablet Take 150 mg by mouth daily as needed for heartburn.    [provider]  sertraline (ZOLOFT) 100 MG tablet Take 100 mg by mouth daily.    [provider]                                                                                                                                    Past Surgical History Past Surgical History:  Procedure Laterality  Date   COLPOSCOPY     x 2   FACIAL COSMETIC SURGERY     nose job   TONSILLECTOMY     WISDOM TOOTH EXTRACTION     Family History History reviewed. No pertinent family history.  Social History Social History   Tobacco Use   Smoking status: Every Day    Current packs/day: 0.50    Types: Cigarettes   Smokeless tobacco: Never  Vaping Use   Vaping status: Never Used  Substance Use Topics   Alcohol use: No   Drug use: Yes    Types: Marijuana   Allergies Patient has no known allergies.  Review of Systems A thorough review of systems was obtained and all systems are negative except as noted in the HPI and PMH.   Physical Exam Vital Signs  I have reviewed the triage vital signs BP 136/70   Pulse (!) 53   Temp 98.5 F (36.9 C)   Resp 18   Ht 5\' 6"  (1.676 m)   Wt 83.9 kg   SpO2 98%   BMI 29.85 kg/m  Physical Exam Vitals and nursing note reviewed.  Constitutional:      General: She is not in acute distress.    Appearance: Normal appearance.  HENT:     Head: Normocephalic and atraumatic.     Right Ear: External ear normal.     Left Ear: External  ear normal.     Nose: Nose normal.     Mouth/Throat:     Mouth: Mucous membranes are dry.  Eyes:     General: No scleral icterus.       Right eye: No discharge.        Left eye: No discharge.  Cardiovascular:     Rate and Rhythm: Normal rate and regular rhythm.     Pulses: Normal pulses.     Heart sounds: Normal heart sounds.  Pulmonary:     Effort: Pulmonary effort is normal. No respiratory distress.     Breath sounds: Normal breath sounds. No stridor.  Abdominal:     General: Abdomen is flat. There is no distension.     Palpations: Abdomen is soft.     Tenderness: There is no abdominal tenderness.  Musculoskeletal:     Cervical back: No rigidity.     Right lower leg: No edema.     Left lower leg: No edema.  Skin:    General: Skin is warm and dry.     Capillary Refill: Capillary refill takes less than 2 seconds.  Neurological:     Mental Status: She is alert.  Psychiatric:        Mood and Affect: Mood normal.        Behavior: Behavior normal. Behavior is cooperative.     ED Results and Treatments Labs (all labs ordered are listed, but only abnormal results are displayed) Labs Reviewed  COMPREHENSIVE METABOLIC PANEL WITH GFR - Abnormal; Notable for the following components:      Result Value   CO2 21 (*)    Glucose, Bld 135 (*)    AST 68 (*)    All other components within normal limits  CBC - Abnormal; Notable for the following components:   WBC 12.5 (*)    All other components within normal limits  URINALYSIS, ROUTINE W REFLEX MICROSCOPIC - Abnormal; Notable for the following components:   Hgb urine dipstick LARGE (*)    Protein, ur >=300 (*)    Leukocytes,Ua TRACE (*)  All other components within normal limits  RAPID URINE DRUG SCREEN, HOSP PERFORMED - Abnormal; Notable for the following components:   Benzodiazepines POSITIVE (*)    Tetrahydrocannabinol POSITIVE (*)    All other components within normal limits  URINALYSIS, MICROSCOPIC (REFLEX) - Abnormal;  Notable for the following components:   Bacteria, UA FEW (*)    All other components within normal limits  RESP PANEL BY RT-PCR (RSV, FLU A&B, COVID)  RVPGX2  LIPASE, BLOOD  PREGNANCY, URINE                                                                                                                          Radiology No results found.  Pertinent labs & imaging results that were available during my care of the patient were reviewed by me and considered in my medical decision making (see MDM for details).  Medications Ordered in ED Medications  ondansetron (ZOFRAN) injection 4 mg (4 mg Intravenous Given 06/01/23 1637)  diphenhydrAMINE (BENADRYL) injection 25 mg (25 mg Intravenous Given 06/01/23 1832)  sodium chloride 0.9 % bolus 1,000 mL (0 mLs Intravenous Stopped 06/01/23 1949)  metoCLOPramide (REGLAN) injection 5 mg (5 mg Intravenous Given 06/01/23 1832)  alum & mag hydroxide-simeth (MAALOX/MYLANTA) 200-200-20 MG/5ML suspension 15 mL (15 mLs Oral Given 06/01/23 1939)                                                                                                                                     Procedures Procedures  (including critical care time)  Medical Decision Making / ED Course    Medical Decision Making:    Giulliana Mcroberts is a 38 y.o. female with past medical history as below, significant for anxiety, insomnia who presents to the ED with complaint of n/ vomiting diarrhea. The complaint involves an extensive differential diagnosis and also carries with it a high risk of complications and morbidity.  Serious etiology was considered. Ddx includes but is not limited to: Foodborne illness, enteritis, gastroenteritis, CHS, pyelonephritis, etc.  Complete initial physical exam performed, notably the patient was in no acute distress, resting on stretcher comfortably, abdomen nontender.    Reviewed and confirmed nursing documentation for past medical history, family history, social  history.  Vital signs reviewed.     Clinical Course as of 06/01/23 2214  Mon Jun 01, 2023  1902 Feeling somewhat better [SG]  2018 Tetrahydrocannabinol(!): POSITIVE [SG]  2203 Feeling better, tolerating po  [SG]    Clinical Course User Index [SG] Sloan Leiter, DO    Brief summary: 38 year old female history as above here with nausea vomiting diarrhea onset this morning.  Exam is reassuring, mucous membranes are moist, abdomen nontender.  She has mild leukocytosis 12.5, likely demargination from vomiting.  Her bicarb is mildly reduced, likely from vomiting/volume loss.  She is feeling much better after fluids and GI cocktail.  Able to tolerate p.o. intake.  Favor likely foodborne illness or enteritis/gastroenteritis.  Abdominal pain precautions, bland diet, antiemetic for home.  Do not feel emergent imaging is warranted at this time given benign abdominal exam, resolution of symptoms.  Stable labs.  The patient improved significantly and was discharged in stable condition. Detailed discussions were had with the patient/guardian regarding current findings, and need for close f/u with PCP or on call doctor. The patient/guardian has been instructed to return immediately if the symptoms worsen in any way for re-evaluation. Patient/guardian verbalized understanding and is in agreement with current care plan. All questions answered prior to discharge.                  Additional history obtained: -Additional history obtained from spouse -External records from outside source obtained and reviewed including: Chart review including previous notes, labs, imaging, consultation notes including  Primary care recommendation, home medications, prior ER visit, prior labs   Lab Tests: -I ordered, reviewed, and interpreted labs.   The pertinent results include:   Labs Reviewed  COMPREHENSIVE METABOLIC PANEL WITH GFR - Abnormal; Notable for the following components:      Result Value    CO2 21 (*)    Glucose, Bld 135 (*)    AST 68 (*)    All other components within normal limits  CBC - Abnormal; Notable for the following components:   WBC 12.5 (*)    All other components within normal limits  URINALYSIS, ROUTINE W REFLEX MICROSCOPIC - Abnormal; Notable for the following components:   Hgb urine dipstick LARGE (*)    Protein, ur >=300 (*)    Leukocytes,Ua TRACE (*)    All other components within normal limits  RAPID URINE DRUG SCREEN, HOSP PERFORMED - Abnormal; Notable for the following components:   Benzodiazepines POSITIVE (*)    Tetrahydrocannabinol POSITIVE (*)    All other components within normal limits  URINALYSIS, MICROSCOPIC (REFLEX) - Abnormal; Notable for the following components:   Bacteria, UA FEW (*)    All other components within normal limits  RESP PANEL BY RT-PCR (RSV, FLU A&B, COVID)  RVPGX2  LIPASE, BLOOD  PREGNANCY, URINE    Notable for as above  EKG   EKG Interpretation Date/Time:    Ventricular Rate:    PR Interval:    QRS Duration:    QT Interval:    QTC Calculation:   R Axis:      Text Interpretation:           Imaging Studies ordered: na   Medicines ordered and prescription drug management: Meds ordered this encounter  Medications   ondansetron (ZOFRAN) injection 4 mg   diphenhydrAMINE (BENADRYL) injection 25 mg   sodium chloride 0.9 % bolus 1,000 mL   metoCLOPramide (REGLAN) injection 5 mg   alum & mag hydroxide-simeth (MAALOX/MYLANTA) 200-200-20 MG/5ML suspension 15 mL   ondansetron (ZOFRAN) 4 MG tablet    Sig: Take 1 tablet (4 mg total) by mouth every 4 (four) hours as needed for nausea or  vomiting.    Dispense:  12 tablet    Refill:  0   sucralfate (CARAFATE) 1 g tablet    Sig: Take 1 tablet (1 g total) by mouth with breakfast, with lunch, and with evening meal for 7 days.    Dispense:  21 tablet    Refill:  0   hydrOXYzine (ATARAX) 25 MG tablet    Sig: Take 1 tablet (25 mg total) by mouth at bedtime as  needed (sleeplessness).    Dispense:  6 tablet    Refill:  0    -I have reviewed the patients home medicines and have made adjustments as needed   Consultations Obtained: na   Cardiac Monitoring: Continuous pulse oximetry interpreted by myself, 100% on RA.    Social Determinants of Health:  Diagnosis or treatment significantly limited by social determinants of health: current smoker   Reevaluation: After the interventions noted above, I reevaluated the patient and found that they have improved  Co morbidities that complicate the patient evaluation  Past Medical History:  Diagnosis Date   Medical history non-contributory       Dispostion: Disposition decision including need for hospitalization was considered, and patient discharged from emergency department.    Final Clinical Impression(s) / ED Diagnoses Final diagnoses:  Nausea and vomiting, unspecified vomiting type  Marijuana use        Sloan Leiter, DO 06/01/23 2214

## 2023-06-01 NOTE — Discharge Instructions (Addendum)
 You should return to the hospital if you experience return of persistent nausea and vomiting that does not resolve and does not allow you to tolerate any food or fluids, persistent fevers for greater than 2-3 more days, increasing abdominal pain that persists despite medications, persistent diarrhea, dizziness, syncope (fainting), or for any other concerns.    Please return to the emergency department immediately for any new or concerning symptoms, or if you get worse.

## 2023-06-01 NOTE — ED Triage Notes (Signed)
 Pt states N/V since 0600 this am  Not tolerating po intake  Denies fever   States feels same as COVID back in December
